# Patient Record
Sex: Female | Born: 1990
Health system: Southern US, Community
[De-identification: ages and names within clinical notes are randomized; demographics above are authoritative.]

## PROBLEM LIST (undated history)

## (undated) ENCOUNTER — Encounter

## (undated) ENCOUNTER — Telehealth

## (undated) DIAGNOSIS — F419 Anxiety disorder, unspecified: Secondary | ICD-10-CM

## (undated) DIAGNOSIS — E785 Hyperlipidemia, unspecified: Secondary | ICD-10-CM

## (undated) HISTORY — DX: Hyperlipidemia, unspecified: E78.5

## (undated) HISTORY — DX: Anxiety disorder, unspecified: F41.9

---
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
## 2016-07-16 ENCOUNTER — Ambulatory Visit: Primary: Family Medicine

## 2016-07-16 DIAGNOSIS — H1013 Acute atopic conjunctivitis, bilateral: Principal | ICD-10-CM

## 2016-07-16 DIAGNOSIS — Z6824 Body mass index (BMI) 24.0-24.9, adult: Principal | ICD-10-CM

## 2016-07-16 DIAGNOSIS — B351 Tinea unguium: Secondary | ICD-10-CM

## 2016-07-16 DIAGNOSIS — E782 Mixed hyperlipidemia: Secondary | ICD-10-CM

## 2016-07-16 MED ORDER — MULTIPLE VITAMIN PO TABS
1 | ORAL_TABLET | Freq: Every day | ORAL
Start: 2016-07-16 — End: ?

## 2016-07-16 MED ORDER — TERBINAFINE HCL 250 MG PO TABS
250 mg | Freq: Every day | ORAL | 0 refills | Status: CP
Start: 2016-07-16 — End: 2016-09-04

## 2016-07-16 MED ORDER — TOLNAFTATE 1 % EX SOLN
1 | Freq: Every day | TOPICAL
Start: 2016-07-16 — End: 2016-10-22

## 2016-07-16 MED ORDER — LASTACAFT 0.25 % OP SOLN
1 [drp] | Freq: Every day | OPHTHALMIC | 1 refills
Start: 2016-07-16 — End: 2016-10-22

## 2016-07-23 NOTE — Progress Notes
BP 104/71 - Pulse 71 - Temp 36.4 ?C (97.5 ?F) - Resp 18 - Ht 1.626 m (5\' 4" ) - Wt 64.4 kg (142 lb) - LMP 06/30/2016 (Exact Date) - BMI 24.37 kg/m2    Physical Exam   Constitutional: She is oriented to person, place, and time. Vital signs are normal. She is cooperative. No distress.   Neurological: She is alert and oriented to person, place, and time.   Skin: Skin is warm, dry and intact. Rash noted. She is not diaphoretic.   Yellow discoloration of great toe bilaterally and R 2nd toe and L 3rd toe. Discoloration is nonhemogenous and in no distributive pattern. Similar discoloration observed under R 2nd toe and L 3rd toe with lifint of the nail from the neail bed. No ertyhema or edema noted in affected area. Non-tender to palpation.        ASSESSMENT/PLAN    Abigail Caldwell was seen today for results and nail problem.    Diagnoses and all orders for this visit:    Body mass index (BMI) of 24.0-24.9 in adult    Other orders  -     LASTACAFT 0.25 % Solution; Apply 1 drop to eye(s) daily.  -     tolnaftate (TINACTIN) 1 % Solution; Apply 1 application topically daily.  -     multivitamin Tablet; Take 1 tablet by mouth daily.  -     terbinafine (lamISIL) 250 MG Tablet; Take 1 tablet by mouth daily for 42 days.  -     LIPID PANEL; Future  -     TSH+FREE T4; Future  -     COMPREHENSIVE METABOLIC PANEL; Future

## 2016-07-23 NOTE — Progress Notes
Review of Systems   Constitutional: Negative for activity change, appetite change, chills, diaphoresis, fatigue, fever and unexpected weight change.   HENT: Negative for congestion, dental problem, drooling, ear discharge, ear pain, facial swelling, hearing loss, mouth sores, nosebleeds, postnasal drip, rhinorrhea, sinus pressure, sneezing, sore throat, tinnitus, trouble swallowing and voice change.    Eyes: Negative for photophobia, pain, discharge, redness, itching and visual disturbance.   Respiratory: Negative for apnea, cough, choking, chest tightness, shortness of breath, wheezing and stridor.    Gastrointestinal: Negative for abdominal distention, abdominal pain, anal bleeding, blood in stool, constipation, diarrhea, nausea, rectal pain and vomiting.   Endocrine: Negative for cold intolerance, heat intolerance, polydipsia, polyphagia and polyuria.   Genitourinary: Negative for decreased urine volume, difficulty urinating, dyspareunia, dysuria, enuresis, flank pain, frequency, genital sores, hematuria, menstrual problem, pelvic pain, urgency, vaginal bleeding, vaginal discharge and vaginal pain.   Musculoskeletal: Negative for arthralgias, back pain, gait problem, joint swelling, myalgias, neck pain and neck stiffness.   Skin: Negative for color change, pallor, rash and wound.   Allergic/Immunologic: Negative for environmental allergies, food allergies and immunocompromised state.   Neurological: Negative for dizziness, tremors, seizures, syncope, facial asymmetry, speech difficulty, weakness, light-headedness, numbness and headaches.   Hematological: Negative for adenopathy. Does not bruise/bleed easily.   Psychiatric/Behavioral: Negative for agitation, behavioral problems, confusion, decreased concentration, dysphoric mood, hallucinations, self-injury, sleep disturbance and suicidal ideas. The patient is not nervous/anxious and is not hyperactive.      Med[facility]     OBJECTIVE

## 2016-07-23 NOTE — Patient Instructions
Fungal Nail Infection  Fungal nail infection is a common fungal infection of the toenails or fingernails. This condition affects toenails more often than fingernails. More than one nail may be infected. The condition can be passed from person to person (is contagious).  CAUSES  This condition is caused by a fungus. Several types of funguses can cause the infection. These funguses are common in moist and warm areas. If your hands or feet come into contact with the fungus, it may get into a crack in your fingernail or toenail and cause the infection.  RISK FACTORS  The following factors may make you more likely to develop this condition:  ? Being female.  ? Having diabetes.  ? Being of older age.  ? Living with someone who has the fungus.  ? Walking barefoot in areas where the fungus thrives, such as showers or locker rooms.  ? Having poor circulation.  ? Wearing shoes and socks that cause your feet to sweat.  ? Having athlete's foot.  ? Having a nail injury or history of a recent nail surgery.  ? Having psoriasis.  ? Having a weak body defense system (immune system).  SYMPTOMS  Symptoms of this condition include:  ? A pale spot on the nail.  ? Thickening of the nail.  ? A nail that becomes yellow or brown.  ? A brittle or ragged nail edge.  ? A crumbling nail.  ? A nail that has lifted away from the nail bed.  DIAGNOSIS  This condition is diagnosed with a physical exam. Your health care provider may take a scraping or clipping from your nail to test for the fungus.  TREATMENT  Mild infections do not need treatment. If you have significant nail changes, treatment may include:  ? Oral antifungal medicines. You may need to take the medicine for several weeks or several months, and you may not see the results for a long time. These medicines can cause side effects. Ask your health care provider what problems to watch for.  ? Antifungal nail polish and nail cream. These may be used along with oral antifungal medicines.

## 2016-07-23 NOTE — Patient Instructions
?   Laser treatment of the nail.  ? Surgery to remove the nail. This may be needed for the most severe infections.  Treatment takes a long time, and the infection may come back.  HOME CARE INSTRUCTIONS  Medicines  ? Take or apply over-the-counter and prescription medicines only as told by your health care provider.  ? Ask your health care provider about using over-the-counter mentholated ointment on your nails.  Lifestyle  ? Do not share personal items, such as towels or nail clippers.  ? Trim your nails often.  ? Wash and dry your hands and feet every day.  ? Wear absorbent socks, and change your socks frequently.  ? Wear shoes that allow air to circulate, such as sandals or canvas tennis shoes. Throw out old shoes.  ? Wear rubber gloves if you are working with your hands in wet areas.  ? Do not walk barefoot in shower rooms or locker rooms.  ? Do not use a nail salon that does not use clean instruments.  ? Do not use artificial nails.  General Instructions  ? Keep all follow-up visits as told by your health care provider. This is important.  ? Use antifungal foot powder on your feet and in your shoes.  SEEK MEDICAL CARE IF:   Your infection is not getting better or it is getting worse after several months.     This information is not intended to replace advice given to you by your health care provider. Make sure you discuss any questions you have with your health care provider.     Document Released: 08/15/2000 Document Revised: 12/10/2015 Document Reviewed: 02/19/2015  Elsevier Interactive Patient Education ?2017 Elsevier Inc.

## 2016-07-23 NOTE — Progress Notes
RTC as scheduled or sooner if no improvement or any other complications.  Patient was advised to contact our office if they have not heard back from us on any testing that was performed or any required referral or ancillary services.  The patient voiced understanding.    Orders Placed This Encounter   Medications   ? terbinafine (lamISIL) 250 MG Tablet     Sig: Take 1 tablet by mouth daily for 42 days.     Dispense:  42 tablet     Refill:  0     Orders Placed This Encounter   Procedures   ? LIPID PANEL   ? TSH+FREE T4   ? COMPREHENSIVE METABOLIC PANEL       Health Maintenance was reviewed. The patient's HM Topic list was:                                            Health Maintenance   Topic Date Due   ? Preventive Wellness Visit  10/30/2016   ? Basic Metabolic Panel  12/18/2016   ? Pap Smear  10/31/2018   ? DTaP,Tdap,and Td Vaccines (7 - Td) 03/29/2019   ? Lipid Profile  12/18/2020   ? USPSTF HIV Risk Assessment  Completed   ? Varicella Vaccine  Completed   ? Influenza Vaccine  Completed

## 2016-07-23 NOTE — Progress Notes
Medication Sig   ? norethindrone-ethinyl estradiol (LOESTRIN 1/20, 21,) 1-20 MG-MCG per tablet daily. 1 mg-mcg (Instructions per Allscripts)     No current facility-administered medications on file prior to visit.      Allergies   Allergen Reactions   ? No Known Drug Allergy      No Known Drug Allergies, No Known Drug Allergies        Objective:        VITAL SIGNS (all recorded)      Clinic Vitals       07/16/16 0835             Amb Encounter Vitals    Weight 64.4 kg (142 lb)    -ED at 07/16/16 0836       Height 1.626 m (5\' 4" )    -ED at 07/16/16 0836       BMI (Calculated) 24.43    -ED at 07/16/16 0836       BSA (Calculated - sq m) 1.71    -ED at 07/16/16 0836       BP 104/71    -ED at 07/16/16 0836       BP Location Left upper arm    -ED at 07/16/16 0836       Position Sitting    -ED at 07/16/16 0836       Pulse 71    -ED at 07/16/16 0836       Pulse Quality Normal    -ED at 07/16/16 0836       Resp 18    -ED at 07/16/16 0836       Respiration Quality Normal    -ED at 07/16/16 0836       Temp 36.4 ?C (97.5 ?F)    -ED at 07/16/16 0836       Temperature Source Oral    -ED at 07/16/16 0836       O2 Saturation 100 %    -ED at 07/16/16 0836       Pain Score Zero    -ED at 07/16/16 0836       Last Menstrual Period 06/30/16    -ED at 07/16/16 0836       Education/Communication Barriers?    Learning/Communication Barriers? No    -ED at 07/16/16 0836       Fall Risk Assessment    Had recent fall / Last 6 months? No recent fall    -ED at 07/16/16 0836       Does patient have a fear of falling? No    -ED at 07/16/16 0836         User Key  (r) = Recorded By, (t) = Taken By, (c) = Cosigned By    [facility] Name Effective Dates    ED Apolonio Schneidersesuyo, Erin, MA 12/04/15 -         Physical Exam   Constitutional: She is oriented to person, place, and time. She appears well-developed and well-nourished. No distress.   HENT:   Head: Normocephalic and atraumatic.   Eyes: Conjunctivae and EOM are normal. Pupils are equal, round, and

## 2016-07-23 NOTE — Progress Notes
Genitourinary: Negative for decreased urine volume, difficulty urinating, dyspareunia, dysuria, enuresis, flank pain, frequency, genital sores, hematuria, menstrual problem, pelvic pain, urgency, vaginal bleeding, vaginal discharge and vaginal pain.   Musculoskeletal: Negative for arthralgias, back pain, gait problem, joint swelling, myalgias, neck pain and neck stiffness.   Skin: Negative for color change, pallor, rash and wound.   Allergic/Immunologic: Negative for environmental allergies, food allergies and immunocompromised state.   Neurological: Negative for dizziness, tremors, seizures, syncope, facial asymmetry, speech difficulty, weakness, light-headedness, numbness and headaches.   Hematological: Negative for adenopathy. Does not bruise/bleed easily.   Psychiatric/Behavioral: Negative for agitation, behavioral problems, confusion, decreased concentration, dysphoric mood, hallucinations, self-injury, sleep disturbance and suicidal ideas. The patient is not nervous/anxious and is not hyperactive.        Past Medical History:   Diagnosis Date   ? Conjunctivitis, allergic, bilateral      Past Surgical History:   Procedure Laterality Date   ? RHINOPLASTY - PRIMARY, COMPLEX--INCLUDES SEPTOPLASTY       Family History   Problem Relation Age of Onset   ? High Blood Pressure Mother    ? No Known Problems Father    ? Heart Disease Maternal Grandfather    ? Heart Disease Paternal Grandfather      Social History     Social History   ? Marital status: Single     Spouse name: N/A   ? Number of children: N/A   ? Years of education: N/A     Occupational History   ? Not on file.     Social History Main Topics   ? Smoking status: Never Smoker   ? Smokeless tobacco: Never Used   ? Alcohol use No   ? Drug use: No   ? Sexual activity: Yes     Partners: Male     Birth control/ protection: OCP, Condom     Other Topics Concern   ? Not on file     Social History Narrative     Current Outpatient Prescriptions on File Prior to Visit

## 2016-07-23 NOTE — Progress Notes
MEDICAL STUDENT NOTE  ID: Abigail Shayatalie Palmateer - 25 y.o. female ([id number])  Reason for Visit: Results and Nail Problem      SUBJECTIVE  Abigail Caldwell is a pleasant 25 yo female with no significant PMH who presents to clinic to discuss recent lab results and for a new onset rash on her toenails. She states that she had a nonfasting lipid panel perfomred 6 months ago which was significant for elevated LDL and TG. She had a repeat fasting lipid panel and CMP performed the following week which showed slight improvement but was still significant for HLD and elevated TG. She acknolwedges making lifestyle changes including exercising 4-5x per week and eliminating meats and dairy from her diet. She admits to having a strong family history of hyperlipidemia     Past Medical History:   Diagnosis Date   ? Conjunctivitis, allergic, bilateral      Past Surgical History:   Procedure Laterality Date   ? RHINOPLASTY - PRIMARY, COMPLEX--INCLUDES SEPTOPLASTY       Social History     Social History   ? Marital status: Single     Spouse name: N/A   ? Number of children: N/A   ? Years of education: N/A     Occupational History   ? Not on file.     Social History Main Topics   ? Smoking status: Never Smoker   ? Smokeless tobacco: Never Used   ? Alcohol use No   ? Drug use: No   ? Sexual activity: Yes     Partners: Male     Birth control/ protection: OCP, Condom     Other Topics Concern   ? Not on file     Social History Narrative     Outpatient Encounter Prescriptions as of 07/16/2016   Medication Sig Dispense Refill   ? multivitamin Tablet Take 1 tablet by mouth daily.     ? tolnaftate (TINACTIN) 1 % Solution Apply 1 application topically daily.     ? LASTACAFT 0.25 % Solution Apply 1 drop to eye(s) daily.  1   ? norethindrone-ethinyl estradiol (LOESTRIN 1/20, 21,) 1-20 MG-MCG per tablet daily. 1 mg-mcg (Instructions per Allscripts)       No facility-administered encounter medications on file as of 07/16/2016.          MedStu[facility]

## 2016-07-23 NOTE — Progress Notes
reactive to light.   Neck: Neck supple. No thyromegaly present.   Cardiovascular: Normal rate, regular rhythm and normal heart sounds.    Pulmonary/Chest: Effort normal and breath sounds normal. No respiratory distress. She has no wheezes. She has no rales.   Musculoskeletal: She exhibits no tenderness or deformity.   Neurological: She is alert and oriented to person, place, and time. No cranial nerve deficit.   Skin: Skin is warm. No erythema.   Yellow discoloration of great toe bilaterally and R 2nd toe and L 3rd toe. Discoloration is nonhemogenous and in no distributive pattern. Similar discoloration observed under R 2nd toe and L 3rd toe with lifint of the nail from the neail bed. No ertyhema or edema noted in affected area. Non-tender to palpation.    Psychiatric: She has a normal mood and affect. Her behavior is normal. Thought content normal.       Assessment:       ICD-10-CM ICD-9-CM    1. Body mass index (BMI) of 24.0-24.9 in adult Z68.24 V85.1    2. Onychomycosis of toenail B35.1 110.1 terbinafine (lamISIL) 250 MG Tablet      COMPREHENSIVE METABOLIC PANEL   3. Mixed hyperlipidemia E78.2 272.2 LIPID PANEL      TSH+FREE T4      TSH+FREE T4       Plan:     The patient was advised of various treatment options and agreed to the following:The risks vs benefits of medication and treatment were discussed.  Medications were reconciled and refills/prescriptions were given as needed.  Safe over the counter medications were discussed.  Goals for self management were discussed, log sheet was provided and previous log sheet was reviewed.  Previous laboratory and ancillary testing was reviewed/ordered today.  Written patient education materials were provided today.  Healthy  Lifestyle including the importance of healthy diet and exercise as tolerated.  Avoidance of tobacco products was emphasized.  The patient was reminded of the importance of follow up visits and compliance with prescribed treatment plan.

## 2016-07-23 NOTE — Progress Notes
Subjective:     Abigail Caldwell is a 25 y.o. female being seen today for Results and Nail Problem    The patient is here today for follow up and management of  medical conditions listed on the problem list. The patient is currently prescribed the medications listed in the medications list.   Pain level and fall risk were assessed.Health issues since the last visit were reviewed.    Pt with no significant PMH who presents to clinic to discuss recent lab results and for a new onset rash on her toenails. She states that she had a nonfasting lipid panel perfomred 6 months ago which was significant for elevated LDL and TG. She had a repeat fasting lipid panel and CMP performed the following week which showed slight improvement but was still significant for HLD and elevated TG. She acknolwedges making lifestyle changes including exercising 4-5x per week and eliminating meats and dairy from her diet. She admits to having a strong family history of hyperlipidemia     Please see review of systems below.    Review of Systems  Review of Systems   Constitutional: Negative for activity change, appetite change, chills, diaphoresis, fatigue, fever and unexpected weight change.   HENT: Negative for congestion, dental problem, drooling, ear discharge, ear pain, facial swelling, hearing loss, mouth sores, nosebleeds, postnasal drip, rhinorrhea, sinus pressure, sneezing, sore throat, tinnitus, trouble swallowing and voice change.    Eyes: Negative for photophobia, pain, discharge, redness, itching and visual disturbance.   Respiratory: Negative for apnea, cough, choking, chest tightness, shortness of breath, wheezing and stridor.    Gastrointestinal: Negative for abdominal distention, abdominal pain, anal bleeding, blood in stool, constipation, diarrhea, nausea, rectal pain and vomiting.   Endocrine: Negative for cold intolerance, heat intolerance, polydipsia, polyphagia and polyuria.

## 2016-09-02 DIAGNOSIS — E782 Mixed hyperlipidemia: Principal | ICD-10-CM

## 2016-09-02 DIAGNOSIS — B351 Tinea unguium: Principal | ICD-10-CM

## 2016-09-03 ENCOUNTER — Inpatient Hospital Stay: Admit: 2016-09-03 | Discharge: 2016-09-03 | Primary: Family Medicine

## 2016-09-04 DIAGNOSIS — B351 Tinea unguium: Principal | ICD-10-CM

## 2016-09-04 MED ORDER — TERBINAFINE HCL 250 MG PO TABS
250 mg | Freq: Every day | ORAL | 0 refills | Status: CP
Start: 2016-09-04 — End: 2016-10-22

## 2016-09-04 NOTE — Telephone Encounter
From: Ree ShayNatalie Anding  To: Riley NearingGajda, Anna, MD  Sent: 09/03/2016 4:43 PM EST  Subject:  Medication Renewal Request    Original  authorizing provider: Riley NearingAnna Gajda, MD    Ree ShayNatalie  Skillin would like a refill of the following medications:  terbinafine  (lamISIL) 250 MG Tablet Riley Nearing[Anna Gajda, MD]    Preferred  pharmacy: [facility] [id number] - [place], [place] - [address]    Comment:  Dr.  Johnnette LitterGajda, Can The 2nd part of the terbinafine be refilled since my CMP came back normal? Thank you! Ree ShayNatalie Parzych

## 2016-10-22 ENCOUNTER — Ambulatory Visit: Attending: Family Medicine | Primary: Family Medicine

## 2016-10-22 DIAGNOSIS — N3000 Acute cystitis without hematuria: Secondary | ICD-10-CM

## 2016-10-22 DIAGNOSIS — R300 Dysuria: Secondary | ICD-10-CM

## 2016-10-22 DIAGNOSIS — Z6823 Body mass index (BMI) 23.0-23.9, adult: Principal | ICD-10-CM

## 2016-10-22 DIAGNOSIS — R399 Unspecified symptoms and signs involving the genitourinary system: Secondary | ICD-10-CM

## 2016-10-22 DIAGNOSIS — H1013 Acute atopic conjunctivitis, bilateral: Principal | ICD-10-CM

## 2016-10-22 MED ORDER — SULFAMETHOXAZOLE-TRIMETHOPRIM 800-160 MG PO TABS
1 | ORAL_TABLET | Freq: Two times a day (BID) | ORAL | 0 refills | Status: CP
Start: 2016-10-22 — End: 2016-10-25

## 2016-10-22 MED ORDER — DICLOFENAC SODIUM 1 % TD GEL
1 refills
Start: 2016-10-22 — End: 2017-11-12

## 2016-10-22 NOTE — Patient Instructions
Urinary Tract Infection, Adult  A urinary tract infection (UTI) is an infection of any part of the urinary tract. The urinary tract includes the:  ? Kidneys.  ? Ureters.  ? Bladder.  ? Urethra.  These organs make, store, and get rid of pee (urine) in the body.  Follow these instructions at home:  ? Take over-the-counter and prescription medicines only as told by your doctor.  ? If you were prescribed an antibiotic medicine, take it as told by your doctor. Do not stop taking the antibiotic even if you start to feel better.  ? Avoid the following drinks:  ? Alcohol.  ? Caffeine.  ? Tea.  ? Carbonated drinks.  ? Drink enough fluid to keep your pee clear or pale yellow.  ? Keep all follow-up visits as told by your doctor. This is important.  ? Make sure to:  ? Empty your bladder often and completely. Do not to hold pee for long periods of time.  ? Empty your bladder before and after sex.  ? Wipe from front to back after a bowel movement if you are female. Use each tissue one time when you wipe.  Contact a doctor if:  ? You have back pain.  ? You have a fever.  ? You feel sick to your stomach (nauseous).  ? You throw up (vomit).  ? Your symptoms do not get better after 3 days.  ? Your symptoms go away and then come back.  Get help right away if:  ? You have very bad back pain.  ? You have very bad lower belly (abdominal) pain.  ? You are throwing up and cannot keep down any medicines or water.  This information is not intended to replace advice given to you by your health care provider. Make sure you discuss any questions you have with your health care provider.  Document Released: 02/04/2008 Document Revised: 01/24/2016 Document Reviewed: 07/09/2015  Elsevier Interactive Patient Education ? 2017 Elsevier Inc.

## 2016-10-24 DIAGNOSIS — N3000 Acute cystitis without hematuria: Secondary | ICD-10-CM

## 2016-10-24 DIAGNOSIS — R399 Unspecified symptoms and signs involving the genitourinary system: Principal | ICD-10-CM

## 2016-10-24 MED ORDER — SULFAMETHOXAZOLE-TRIMETHOPRIM 800-160 MG PO TABS
1 | ORAL_TABLET | Freq: Two times a day (BID) | ORAL | 0 refills | Status: CP
Start: 2016-10-24 — End: 2017-11-12

## 2016-10-25 NOTE — Progress Notes
?   Smoking status: Never Smoker   ? Smokeless tobacco: Never Used   ? Alcohol use No   ? Drug use: No   ? Sexual activity: Yes     Partners: Male     Birth control/ protection: OCP, Condom     Other Topics Concern   ? Not on file     Social History Narrative     Current Outpatient Prescriptions on File Prior to Visit   Medication Sig   ? [DISCONTINUED] LASTACAFT 0.25 % Solution Apply 1 drop to eye(s) daily.   ? multivitamin Tablet Take 1 tablet by mouth daily.   ? norethindrone-ethinyl estradiol (LOESTRIN 1/20, 21,) 1-20 MG-MCG per tablet daily. 1 mg-mcg (Instructions per Allscripts)   ? [DISCONTINUED] terbinafine (lamISIL) 250 MG PO Tablet Take 1 tablet by mouth daily for 42 days.   ? [DISCONTINUED] tolnaftate (TINACTIN) 1 % Solution Apply 1 application topically daily.     No current facility-administered medications on file prior to visit.      Allergies   Allergen Reactions   ? No Known Drug Allergy      No Known Drug Allergies, No Known Drug Allergies         Review of Systems  Review of Systems   Constitutional: Negative for activity change, appetite change, chills, diaphoresis, fatigue, fever and unexpected weight change.   HENT: Negative for congestion, ear pain, mouth sores, sinus pain and tinnitus.    Eyes: Negative for pain, discharge and redness.   Respiratory: Negative for cough, chest tightness and wheezing.    Cardiovascular: Negative for chest pain and leg swelling.   Gastrointestinal: Negative for abdominal distention, abdominal pain, anorexia, nausea and vomiting.   Endocrine: Negative for cold intolerance, polydipsia, polyphagia and polyuria.   Genitourinary: Positive for dysuria, frequency and pelvic pain. Negative for difficulty urinating, flank pain, hematuria, hesitancy and urgency.   Musculoskeletal: Negative for arthralgias, joint swelling, myalgias and neck pain.   Skin: Negative for pallor and rash.   Neurological: Negative for dizziness, weakness, light-headedness, numbness

## 2016-10-25 NOTE — Progress Notes
and headaches.           Objective:        VITAL SIGNS (all recorded)      Clinic Vitals       10/22/16 0734             Amb Encounter Vitals    Weight 63 kg (139 lb)    -ED at 10/22/16 0735       Height 1.626 m (5\' 4" )    -ED at 10/22/16 0735       BMI (Calculated) 23.91    -ED at 10/22/16 0735       BSA (Calculated - sq m) 1.69    -ED at 10/22/16 0735       BP 116/74    -ED at 10/22/16 0735       BP Location Left upper arm    -ED at 10/22/16 0735       Position Sitting    -ED at 10/22/16 0735       Pulse 64    -ED at 10/22/16 0735       Pulse Quality Normal    -ED at 10/22/16 0735       Resp 18    -ED at 10/22/16 0735       Respiration Quality Normal    -ED at 10/22/16 0735       Temp 37.2 ?C (99 ?F)    -ED at 10/22/16 0735       Temperature Source Oral    -ED at 10/22/16 0735       O2 Saturation 99 %    -ED at 10/22/16 0735       Pain Score Three    -ED at 10/22/16 0735       Last Menstrual Period 10/15/16    -ED at 10/22/16 16100735       Education/Communication Barriers?    Learning/Communication Barriers? No    -ED at 10/22/16 0735       Fall Risk Assessment    Had recent fall / Last 6 months? No recent fall    -ED at 10/22/16 [id number]       Does patient have a fear of falling? No    -ED at 10/22/16 0735         User Key  (r) = Recorded By, (t) = Taken By, (c) = Cosigned By    [facility]    ED Apolonio Schneidersesuyo, Erin, MA 12/04/15 -         Physical Exam   Constitutional: She is oriented to person, place, and time. She appears well-developed and well-nourished. No distress.   HENT:   Head: Normocephalic.   Mouth/Throat: Oropharynx is clear and moist.   Eyes: EOM are normal. Pupils are equal, round, and reactive to light. Right eye exhibits no discharge. Left eye exhibits no discharge.   Neck: Normal range of motion. Neck supple. No thyromegaly present.   Cardiovascular: Normal rate and regular rhythm.    No murmur heard.  Pulmonary/Chest: Effort normal. She has no wheezes.

## 2016-10-25 NOTE — Progress Notes
Abdominal: Soft. Bowel sounds are normal. She exhibits no distension and no mass. There is no tenderness.   Musculoskeletal: Normal range of motion. She exhibits no edema or tenderness.   Lymphadenopathy:     She has no cervical adenopathy.   Neurological: She is alert and oriented to person, place, and time. No cranial nerve deficit. Coordination normal.   Skin: Skin is warm and dry. She is not diaphoretic. No erythema.   Vitals reviewed.       Assessment:       ICD-10-CM ICD-9-CM    1. Acute cystitis without hematuria N30.00 595.0 sulfamethoxazole-trimethoprim (BACTRIM DS,SEPTRA DS) 800-160 MG PO Tablet   2. Body mass index (BMI) of 23.0-23.9 in adult Z68.23 V85.1    3. Symptoms involving urinary system R39.9 788.99 POCT Urinalysis w/out microscopy inhouse-AUTO      sulfamethoxazole-trimethoprim (BACTRIM DS,SEPTRA DS) 800-160 MG PO Tablet   4. Dysuria R30.0 788.1           Plan:     Orders Placed This Encounter   Medications   ? sulfamethoxazole-trimethoprim (BACTRIM DS,SEPTRA DS) 800-160 MG PO Tablet     Sig: Take 1 tablet by mouth 2 times daily.     Dispense:  6 tablet     Refill:  0     Orders Placed This Encounter   Procedures   ? POCT Urinalysis w/out microscopy inhouse-AUTO       Health Maintenance was reviewed. The patient's HM Topic list was:                                            Health Maintenance   Topic Date Due   ? Preventive Wellness Visit  10/30/2016   ? Basic Metabolic Panel  09/02/2017   ? Pap Smear  10/31/2018   ? DTaP,Tdap,and Td Vaccines (7 - Td) 03/29/2019   ? Lipid Profile  09/02/2021   ? USPSTF HIV Risk Assessment  Completed   ? Varicella Vaccine  Completed   ? Influenza Vaccine  Completed       Return if symptoms worsen or fail to improve.  AND   PRN.    POCT Urinalysis w/out microscopy inhouse-AUTO (Order [id number])   POCT Urinalysis w/out microscopy inhouse-AUTO   Order: [id number]   Status:  Final result ??Visible to patient:  Yes ([facility]) Next appt:

## 2016-10-25 NOTE — Progress Notes
Subjective:   Ree Shayatalie Barich is a 26 y.o. female being seen today for Dysuria and Urinary Tract Infection       Dysuria    This is a new problem. The current episode started in the past 7 days. The problem occurs every urination. The problem has been unchanged. The quality of the pain is described as burning. The patient is experiencing no pain. There has been no fever. She is sexually active. There is no history of pyelonephritis. Associated symptoms include frequency. Pertinent negatives include no chills, discharge, flank pain, hematuria, hesitancy, nausea, possible pregnancy, sweats, urgency or vomiting. She has tried antibiotics for the symptoms. The treatment provided mild relief. There is no history of catheterization, kidney stones, recurrent UTIs, a single kidney, urinary stasis or a urological procedure.   Urinary Tract Infection   This is a new problem. The current episode started in the past 7 days. The problem occurs rarely. The problem has been unchanged. Associated symptoms include urinary symptoms. Pertinent negatives include no abdominal pain, anorexia, arthralgias, chest pain, chills, congestion, coughing, diaphoresis, fatigue, fever, headaches, joint swelling, myalgias, nausea, neck pain, numbness, rash, vomiting or weakness. Nothing aggravates the symptoms. The treatment provided mild relief.       Past Medical History:   Diagnosis Date   ? Conjunctivitis, allergic, bilateral      Past Surgical History:   Procedure Laterality Date   ? RHINOPLASTY - PRIMARY, COMPLEX--INCLUDES SEPTOPLASTY       Family History   Problem Relation Age of Onset   ? High Blood Pressure Mother    ? No Known Problems Father    ? Heart Disease Maternal Grandfather    ? Heart Disease Paternal Grandfather      Social History     Social History   ? Marital status: Single     Spouse name: N/A   ? Number of children: N/A   ? Years of education: N/A     Occupational History   ? Not on file.     Social History Main Topics

## 2016-10-25 NOTE — Progress Notes
None Dx:  Symptoms involving urinary system      2d ago     Clarity, UA cloudy   Color -Ur dark brown   Glucose -Ur negative   Bilirubin -Ur negative   Ketones negative   Spec Grav 1.015   Blood, UA negative   pH 5.0   Protein -Ur negative   Urobilinogen -Ur 0.25   Nitrite -Ur positive   Leukocytes negative   Appearance, Fluid    Lot Number    Expiration Date       Specimen Collected: 10/22/16 Last Resulted: 10/22/16 Lab Flowsheet Order Details View Encounter Lab and Collection Details                     Discussed plan of care with patient.  Patient voiced agreement and understanding.    Patient is also advised to various treatment options and agreed to the following: taking medications as directed, keeping follow up visits.  Pt voiced agreement and understanding.    Lifestyle  Goals :  Increase exercise / activity minimum 150 minutes per week of low impact cardiovascular exercise.    Treatment goals :  Take medications as prescribed and make sure health maintenance is up to date.    Self - management goals : Decrease sugar and salt intake.  Keep  Total calories intake to less than 2000 calories per day.    Potential barriers to goals : Motivation, medication side effects.      Dip U / A  WITH  POSITIVE NITRITE.

## 2017-11-12 ENCOUNTER — Encounter

## 2017-11-12 ENCOUNTER — Ambulatory Visit: Primary: Family Medicine

## 2017-11-12 DIAGNOSIS — M7750 Other enthesopathy of unspecified foot: Secondary | ICD-10-CM

## 2017-11-12 DIAGNOSIS — T753XXA Motion sickness, initial encounter: Secondary | ICD-10-CM

## 2017-11-12 DIAGNOSIS — Z6824 Body mass index (BMI) 24.0-24.9, adult: Principal | ICD-10-CM

## 2017-11-12 DIAGNOSIS — E538 Deficiency of other specified B group vitamins: Secondary | ICD-10-CM

## 2017-11-12 DIAGNOSIS — B379 Candidiasis, unspecified: Secondary | ICD-10-CM

## 2017-11-12 DIAGNOSIS — E782 Mixed hyperlipidemia: Principal | ICD-10-CM

## 2017-11-12 MED ORDER — DICLOFENAC SODIUM 1 % TD GEL
4 g | Freq: Four times a day (QID) | TOPICAL | 3 refills | Status: CP
Start: 2017-11-12 — End: ?

## 2017-11-12 MED ORDER — FLUCONAZOLE 150 MG PO TABS
150 mg | Freq: Once | ORAL | 1 refills | Status: CP
Start: 2017-11-12 — End: ?

## 2017-11-12 MED ORDER — NUVARING 0.12-0.015 MG/24HR VA RING
1 | VAGINAL | 5 refills
Start: 2017-11-12 — End: ?

## 2017-11-12 MED ORDER — SCOPOLAMINE 1 MG/3DAYS TD PT72
1 | MEDICATED_PATCH | TRANSDERMAL | 1 refills | Status: CP
Start: 2017-11-12 — End: ?

## 2017-11-12 NOTE — Patient Instructions
Talk to your pediatrician regarding the use of this medicine in children. Special care may be needed.  Overdosage: If you think you have taken too much of this medicine contact a poison control center or emergency room at once.  NOTE: This medicine is only for you. Do not share this medicine with others.  What if I miss a dose?  Make sure you apply the patch at least 4 hours before you need it. You can apply it the night before traveling.  What may interact with this medicine?  -benztropine  -bethanechol  -medicines for anxiety or sleeping problems like diazepam or temazepam  -medicines for hay fever and other allergies  -medicines for mental depression  -muscle relaxants  This list may not describe all possible interactions. Give your health care provider a list of all the medicines, herbs, non-prescription drugs, or dietary supplements you use. Also tell them if you smoke, drink alcohol, or use illegal drugs. Some items may interact with your medicine.  What should I watch for while using this medicine?  Keep the patch dry, if possible, to prevent it from falling off. Limited contact with water, however, as in bathing or swimming, will not affect the system. If the patch falls off, throw it away and put a new one behind the other ear.  You may get drowsy or dizzy. Do not drive, use machinery, or do anything that needs mental alertness until you know how this medicine affects you. Do not stand or sit up quickly, especially if you are an older patient. This reduces the risk of dizzy or fainting spells. Alcohol may interfere with the effect of this medicine. Avoid alcoholic drinks.  Your mouth may get dry. Chewing sugarless gum or sucking hard candy, and drinking plenty of water may help. Contact your doctor if the problem does not go away or is severe.  This medicine may cause dry eyes and blurred vision. If you wear contact lenses you may feel some discomfort. Lubricating drops may help. See your

## 2017-11-12 NOTE — Patient Instructions
eye doctor if the problem does not go away or is severe.  If you are going to have a magnetic resonance imaging (MRI) procedure, tell your MRI technician if you have this patch on your body. It must be removed before a MRI.  What side effects may I notice from receiving this medicine?  Side effects that you should report to your doctor or health care professional as soon as possible:  -agitation, nervousness, confusion  -blurred vision and other eye problems  -dizziness, drowsiness  -eye pain or redness in the whites of the eye  -hallucinations  -pain or difficulty passing urine  -skin rash, itching  -vomiting  Side effects that usually do not require medical attention (report to your doctor or health care professional if they continue or are bothersome):  -headache  -nausea  This list may not describe all possible side effects. Call your doctor for medical advice about side effects. You may report side effects to FDA at 1-800-FDA-1088.  Where should I keep my medicine?  Keep out of the reach of children.  Store at room temperature between 20 and 25 degrees C (68 and 77 degrees F). Throw away any unused medicine after the expiration date. When you remove a patch, fold it and throw it in the trash as described above.  NOTE: This sheet is a summary. It may not cover all possible information. If you have questions about this medicine, talk to your doctor, pharmacist, or health care provider.  ? 2019 Elsevier/Gold Standard (2012-01-15 13:31:48)

## 2017-11-12 NOTE — Patient Instructions
Scopolamine skin patches  What is this medicine?  SCOPOLAMINE (skoe POL a meen) is used to prevent nausea and vomiting caused by motion sickness, anesthesia and surgery.  This medicine may be used for other purposes; ask your health care provider or pharmacist if you have questions.  COMMON BRAND NAME(S): Transderm Scop  What should I tell my health care provider before I take this medicine?  They need to know if you have any of these conditions:  -glaucoma  -kidney or liver disease  -an unusual or allergic reaction (especially skin allergy) to scopolamine, atropine, other medicines, foods, dyes, or preservatives  -pregnant or trying to get pregnant  -breast-feeding  How should I use this medicine?  This medicine is for external use only. Follow the directions on the prescription label. One patch contains enough medicine to prevent motion sickness for up to 3 days. Apply the patch at least 4 hours before you need it and only wear one disc at a time. Choose an area behind the ear, that is clean, dry, hairless and free from any cuts or irritation. Wipe the area with a clean dry tissue. Peel off the plastic backing of the skin patch, trying not to touch the adhesive side with your hands. Do not cut the patches. Firmly apply to the area you have chosen, with the metallic side of the patch to the skin and the tan-colored side showing. Once firmly in place, wash your hands well with soap and water. Remove the disc after 3 days, or sooner if you no longer need it. After removing the patch, wash your hands and the area behind your ear thoroughly with soap and water. The patch will still contain some medicine after use. To avoid accidental contact or ingestion by children or pets, fold the used patch in half with the sticky side together and throw away in the trash out of the reach of children and pets. If you need to use a second patch after you remove the first, place it behind the other ear.

## 2017-11-16 NOTE — Progress Notes
-  ED at 11/12/17 1008       BP 120/82    -ED at 11/12/17 1008       BP Location Left upper arm    -ED at 11/12/17 1008       Position Sitting    -ED at 11/12/17 1008       Pulse 68    -ED at 11/12/17 1008       Pulse Quality Normal    -ED at 11/12/17 1008       Resp 16    -ED at 11/12/17 1008       Respiration Quality Normal    -ED at 11/12/17 1008       Temp 37 ?C (98.6 ?F)    -ED at 11/12/17 1008       Temperature Source Oral    -ED at 11/12/17 1008       O2 Saturation 100 %    -ED at 11/12/17 1008       Pain Score Zero    -ED at 11/12/17 1008       Last Menstrual Period 11/07/17    -ED at 11/12/17 1008          Education/Communication Barriers?    Learning/Communication Barriers? No    -ED at 11/12/17 1008          Fall Risk Assessment    Had recent fall / Last 6 months? No recent fall    -ED at 11/12/17 1008       Does patient have a fear of falling? No    -ED at 11/12/17 1008         User Key  (r) = Recorded By, (t) = Taken By, (c) = Cosigned By    [facility] D[facility]    ED Apolonio Schneidersesuyo, Erin, MA 12/04/15 -           Physical Exam   Constitutional: She is oriented to person, place, and time. She appears well-developed and well-nourished. No distress.   HENT:   Head: Normocephalic and atraumatic.   Right Ear: External ear normal.   Left Ear: External ear normal.   Nose: Nose normal.   Mouth/Throat: Oropharynx is clear and moist. No oropharyngeal exudate.   Eyes: Pupils are equal, round, and reactive to light. Conjunctivae and EOM are normal. Right eye exhibits no discharge. Left eye exhibits no discharge. No scleral icterus.   Neck: No JVD present. No thyromegaly present.   Cardiovascular: Normal rate, regular rhythm, normal heart sounds and intact distal pulses.  Exam reveals no gallop and no friction rub.    No murmur heard.  Pulmonary/Chest: Effort normal and breath sounds normal. No respiratory distress. She has no wheezes. She has no rales. She exhibits no tenderness.

## 2017-11-16 NOTE — Progress Notes
History, Medications, Allergies:     Past Medical History:   Diagnosis Date   ? Conjunctivitis, allergic, bilateral      Past Surgical History:   Procedure Laterality Date   ? RHINOPLASTY - PRIMARY, COMPLEX--INCLUDES SEPTOPLASTY       Family History   Problem Relation Age of Onset   ? High Blood Pressure Mother    ? No Known Problems Father    ? Heart Disease Maternal Grandfather    ? Heart Disease Paternal Grandfather      Social History     Social History   ? Marital status: Single     Spouse name: N/A   ? Number of children: N/A   ? Years of education: N/A     Occupational History   ? Not on file.     Social History Main Topics   ? Smoking status: Never Smoker   ? Smokeless tobacco: Never Used   ? Alcohol use No   ? Drug use: No   ? Sexual activity: Yes     Partners: Male     Birth control/ protection: OCP, Condom     Other Topics Concern   ? Not on file     Social History Narrative   ? No narrative on file     Current Outpatient Prescriptions on File Prior to Visit   Medication Sig   ? [DISCONTINUED] diclofenac (VOLTAREN GEL) 1 % TD Gel APPLY 1 GRAM FOUR TIMES A DAY   ? multivitamin Tablet Take 1 tablet by mouth daily.   ? [DISCONTINUED] norethindrone-ethinyl estradiol (LOESTRIN 1/20, 21,) 1-20 MG-MCG per tablet daily. 1 mg-mcg (Instructions per Allscripts)   ? [DISCONTINUED] sulfamethoxazole-trimethoprim (BACTRIM DS,SEPTRA DS) 800-160 MG PO Tablet Take 1 tablet by mouth 2 times daily.     No current facility-administered medications on file prior to visit.      Allergies   Allergen Reactions   ? No Known Drug Allergy      No Known Drug Allergies, No Known Drug Allergies       Objective:        VITAL SIGNS (all recorded)      Clinic Vitals     Row Name 11/12/17 1007                Amb Encounter Vitals    Weight 64 kg (141 lb)    -ED at 11/12/17 1008       Height 1.626 m (5\' 4" )    -ED at 11/12/17 1008       BMI (Calculated) 24.25    -ED at 11/12/17 1008       BSA (Calculated - sq m) 1.7

## 2017-11-16 NOTE — Progress Notes
Subjective:     We had the pleasure of seeing Abigail Caldwell who is a 27 y.o.  Caucasian female being seen today for Medication - New; Medications Refill; and Follow-up    The patient is also here today for follow up and management of multiple chronic medical conditions listed on the problem list. The patient is currently prescribed the medications listed in the medications list.    Previously recommended goals for self management and compliance were reviewed. Pain level and fall risk were assessed. Health issues since the last visit were reviewed. Current medical conditions are stable except as listed above.    Today, patient presents with a request for medications due to the fact she [place] and will be flying, boating, and an abundant amount of physical activity. She reports she has tendinitis in her foot and will need something to control her pain. Abigail Caldwell is requesting Diflucan, Voltaren gel, and Scopolamine patches.     The patient also presents with a request to get her B12 levels tested. She reports that in college she was told that her levels were borderline low, and admits to previously taking B12 supplements. Abigail Caldwell reports that she has not taken any B12 supplements "in a while" and is concerned about her bodies current levels. She also admits to currently taking a daily multivitamin.     Please see review of systems below.         Review of Systems:   Review of Systems   Constitutional: Negative for activity change, appetite change, chills, fatigue, fever and unexpected weight change.   Respiratory: Negative for cough, choking, chest tightness, shortness of breath and wheezing.    Cardiovascular: Negative for chest pain, palpitations and leg swelling.   Endocrine: Negative for cold intolerance, heat intolerance, polydipsia, polyphagia and polyuria.   Musculoskeletal:        Tendinitis in foot   All other systems reviewed and are negative.

## 2017-11-16 NOTE — Progress Notes
us on any testing that was performed or any required referral or ancillary services.  The patient voiced understanding.      Orders Placed This Encounter   Medications   ? diclofenac (VOLTAREN GEL) 1 % TD Gel     Sig: Apply 4 g topically 4 times daily. **Measure dose using enclosed dose card**     Dispense:  100 g     Refill:  3   ? scopolamine (TRANSDERM-SCOP) 1.5 MG/3DAYS TD Patch 72 Hour     Sig: Place 1 patch onto the skin every 72 hours.     Dispense:  4 patch     Refill:  1   ? fluconazole (DIFLUCAN) 150 MG PO Tablet     Sig: Take 1 tablet by mouth once for 1 dose.     Dispense:  1 tablet     Refill:  1     Orders Placed This Encounter   Procedures   ? COMPREHENSIVE METABOLIC PANEL   ? LIPID PANEL   ? TSH   ? VITAMIN B12   ? CBC with Differential panel result       Health Maintenance was reviewed. The patient's HM Topic list was:                                            Health Maintenance   Topic Date Due   ? Preventive Wellness Visit  02/12/2018 (Originally 10/30/2016)   ? Pap Smear  10/31/2018   ? Basic Metabolic Panel  11/13/2018   ? DTaP,Tdap,and Td Vaccines (7 - Td) 03/29/2019   ? Lipid Profile  11/13/2022   ? USPSTF HIV Risk Assessment  Completed   ? Varicella Vaccine  Completed   ? Influenza Vaccine  Completed     RTC as needed    Scribe Attestation: I, Romero LinerVictoria Crawford, have acted as a Neurosurgeonscribe for Riley NearingAnna Gajda, MD 10:50 AM 11/12/2017     Physician Attestation: I have reviewed and confirmed the information stated by the scribe and made corrections and edits as appropriate.  I have personally provided the services documented by the scribe.      Riley NearingAnna Gajda, MD

## 2017-11-16 NOTE — Progress Notes
History, Medications, Allergies:     Past Medical History:   Diagnosis Date   ? Conjunctivitis, allergic, bilateral      Past Surgical History:   Procedure Laterality Date   ? RHINOPLASTY - PRIMARY, COMPLEX--INCLUDES SEPTOPLASTY       Family History   Problem Relation Age of Onset   ? High Blood Pressure Mother    ? No Known Problems Father    ? Heart Disease Maternal Grandfather    ? Heart Disease Paternal Grandfather      Social History     Social History   ? Marital status: Single     Spouse name: N/A   ? Number of children: N/A   ? Years of education: N/A     Occupational History   ? Not on file.     Social History Main Topics   ? Smoking status: Never Smoker   ? Smokeless tobacco: Never Used   ? Alcohol use No   ? Drug use: No   ? Sexual activity: Yes     Partners: Male     Birth control/ protection: OCP, Condom     Other Topics Concern   ? Not on file     Social History Narrative   ? No narrative on file     Current Outpatient Prescriptions on File Prior to Visit   Medication Sig   ? multivitamin Tablet Take 1 tablet by mouth daily.     No current facility-administered medications on file prior to visit.      Allergies   Allergen Reactions   ? No Known Drug Allergy      No Known Drug Allergies, No Known Drug Allergies       Objective:        VITAL SIGNS (all recorded)      Clinic Vitals     Row Name 11/12/17 1007                Amb Encounter Vitals    Weight 64 kg (141 lb)    -ED at 11/12/17 1008       Height 1.626 m (5\' 4" )    -ED at 11/12/17 1008       BMI (Calculated) 24.25    -ED at 11/12/17 1008       BSA (Calculated - sq m) 1.7    -ED at 11/12/17 1008       BP 120/82    -ED at 11/12/17 1008       BP Location Left upper arm    -ED at 11/12/17 1008       Position Sitting    -ED at 11/12/17 1008       Pulse 68    -ED at 11/12/17 1008       Pulse Quality Normal    -ED at 11/12/17 1008       Resp 16    -ED at 11/12/17 1008       Respiration Quality Normal    -ED at 11/12/17 1008       Temp 37 ?C (98.6 ?F)

## 2017-11-16 NOTE — Progress Notes
-       fluconazole (DIFLUCAN) 150 MG PO Tablet; Take 1 tablet by mouth once for 1 dose.    Vitamin B12 deficiency  -     VITAMIN B12; Future    Other orders  -     NUVARING 0.12-0.015 MG/24HR VA Ring; Place 1 ring vaginally every 30 days.      RTC as scheduled or sooner if no improvement or any other complications.  Patient was advised to contact our office if they have not heard back from us on any testing that was performed or any required referral or ancillary services.  The patient voiced understanding.      Orders Placed This Encounter   Medications   ? diclofenac (VOLTAREN GEL) 1 % TD Gel     Sig: Apply 4 g topically 4 times daily. **Measure dose using enclosed dose card**     Dispense:  100 g     Refill:  3   ? scopolamine (TRANSDERM-SCOP) 1.5 MG/3DAYS TD Patch 72 Hour     Sig: Place 1 patch onto the skin every 72 hours.     Dispense:  4 patch     Refill:  1   ? fluconazole (DIFLUCAN) 150 MG PO Tablet     Sig: Take 1 tablet by mouth once for 1 dose.     Dispense:  1 tablet     Refill:  1     Orders Placed This Encounter   Procedures   ? COMPREHENSIVE METABOLIC PANEL   ? LIPID PANEL   ? TSH   ? VITAMIN B12   ? CBC with Differential panel result       Health Maintenance was reviewed. The patient's HM Topic list was:                                            Health Maintenance   Topic Date Due   ? Preventive Wellness Visit  02/12/2018 (Originally 10/30/2016)   ? Basic Metabolic Panel  02/12/2018 (Originally 09/02/2017)   ? Pap Smear  10/31/2018   ? DTaP,Tdap,and Td Vaccines (7 - Td) 03/29/2019   ? Lipid Profile  09/02/2021   ? USPSTF HIV Risk Assessment  Completed   ? Varicella Vaccine  Completed   ? Influenza Vaccine  Completed     RTC as needed    Scribe Attestation: I, Romero LinerVictoria Crawford, have acted as a Neurosurgeonscribe for Riley NearingAnna Gajda, MD 10:50 AM 11/12/2017     Physician Attestation: Marland Kitchen***

## 2017-11-16 NOTE — Progress Notes
rash noted. She is not diaphoretic. No erythema. No pallor.   Psychiatric: She has a normal mood and affect. Her behavior is normal. Judgment and thought content normal.   Nursing note and vitals reviewed.          Assessment:       ICD-10-CM ICD-9-CM    1. Tendinitis of ankle or foot M77.50 727.06 diclofenac (VOLTAREN GEL) 1 % TD Gel   2. Body mass index (BMI) of 24.0-24.9 in adult Z68.24 V85.1    3. Mixed hyperlipidemia E78.2 272.2 COMPREHENSIVE METABOLIC PANEL      LIPID PANEL      TSH      CBC AND DIFFERENTIAL      COMPREHENSIVE METABOLIC PANEL      LIPID PANEL      TSH      CBC AND DIFFERENTIAL      CBC with Differential panel result   4. Motion sickness, initial encounter T75.3XXA 994.6 scopolamine (TRANSDERM-SCOP) 1.5 MG/3DAYS TD Patch 72 Hour   5. Candidiasis B37.9 112.9 fluconazole (DIFLUCAN) 150 MG PO Tablet   6. Vitamin B12 deficiency E53.8 266.2 VITAMIN B12       Plan:     Ree Shayatalie Henneke was seen today for medication - new, medications refill and follow-up.    Diagnoses and all orders for this visit:    Body mass index (BMI) of 24.0-24.9 in adult    Mixed hyperlipidemia  -     COMPREHENSIVE METABOLIC PANEL; Future  -     LIPID PANEL; Future  -     TSH; Future  -     CBC AND DIFFERENTIAL; Future  -     CBC with Differential panel result    Tendinitis of ankle or foot  -     diclofenac (VOLTAREN GEL) 1 % TD Gel; Apply 4 g topically 4 times daily. **Measure dose using enclosed dose card**    Motion sickness, initial encounter  -     scopolamine (TRANSDERM-SCOP) 1.5 MG/3DAYS TD Patch 72 Hour; Place 1 patch onto the skin every 72 hours.    Candidiasis  -     fluconazole (DIFLUCAN) 150 MG PO Tablet; Take 1 tablet by mouth once for 1 dose.    Vitamin B12 deficiency  -     VITAMIN B12; Future          RTC as scheduled or sooner if no improvement or any other complications.  Patient was advised to contact our office if they have not heard back from

## 2017-11-16 NOTE — Progress Notes
Musculoskeletal: Normal range of motion. She exhibits no edema, tenderness or deformity.   Neurological: She is alert and oriented to person, place, and time. She displays normal reflexes. No cranial nerve deficit or sensory deficit. She exhibits normal muscle tone. Coordination normal.   Skin: Skin is warm and dry. Capillary refill takes less than 2 seconds. No rash noted. She is not diaphoretic. No erythema. No pallor.   Psychiatric: She has a normal mood and affect. Her behavior is normal. Judgment and thought content normal.   Nursing note and vitals reviewed.          Assessment:       ICD-10-CM ICD-9-CM    1. Body mass index (BMI) of 24.0-24.9 in adult Z68.24 V85.1    2. Mixed hyperlipidemia E78.2 272.2 COMPREHENSIVE METABOLIC PANEL      LIPID PANEL      TSH      CBC AND DIFFERENTIAL      COMPREHENSIVE METABOLIC PANEL      LIPID PANEL      TSH      CBC AND DIFFERENTIAL      CBC with Differential panel result   3. Tendinitis of ankle or foot M77.50 727.06 diclofenac (VOLTAREN GEL) 1 % TD Gel   4. Motion sickness, initial encounter T75.3XXA 994.6 scopolamine (TRANSDERM-SCOP) 1.5 MG/3DAYS TD Patch 72 Hour   5. Candidiasis B37.9 112.9 fluconazole (DIFLUCAN) 150 MG PO Tablet   6. Vitamin B12 deficiency E53.8 266.2 VITAMIN B12       Plan:     Abigail Caldwell was seen today for medication - new, medications refill and follow-up.    Diagnoses and all orders for this visit:    Body mass index (BMI) of 24.0-24.9 in adult    Mixed hyperlipidemia  -     COMPREHENSIVE METABOLIC PANEL; Future  -     LIPID PANEL; Future  -     TSH; Future  -     CBC AND DIFFERENTIAL; Future  -     CBC with Differential panel result    Tendinitis of ankle or foot  -     diclofenac (VOLTAREN GEL) 1 % TD Gel; Apply 4 g topically 4 times daily. **Measure dose using enclosed dose card**    Motion sickness, initial encounter  -     scopolamine (TRANSDERM-SCOP) 1.5 MG/3DAYS TD Patch 72 Hour; Place 1 patch onto the skin every 72 hours.    Candidiasis

## 2017-11-16 NOTE — Progress Notes
-  ED at 11/12/17 1008       Temperature Source Oral    -ED at 11/12/17 1008       O2 Saturation 100 %    -ED at 11/12/17 1008       Pain Score Zero    -ED at 11/12/17 1008       Last Menstrual Period 11/07/17    -ED at 11/12/17 1008          Education/Communication Barriers?    Learning/Communication Barriers? No    -ED at 11/12/17 1008          Fall Risk Assessment    Had recent fall / Last 6 months? No recent fall    -ED at 11/12/17 1008       Does patient have a fear of falling? No    -ED at 11/12/17 1008         User Key  (r) = Recorded By, (t) = Taken By, (c) = Cosigned By    [facility] Name Effective Dates    ED Apolonio Schneidersesuyo, Erin, MA 12/04/15 -           Physical Exam   Constitutional: She is oriented to person, place, and time. She appears well-developed and well-nourished. No distress.   HENT:   Head: Normocephalic and atraumatic.   Right Ear: External ear normal.   Left Ear: External ear normal.   Nose: Nose normal.   Mouth/Throat: Oropharynx is clear and moist. No oropharyngeal exudate.   Eyes: Pupils are equal, round, and reactive to light. Conjunctivae and EOM are normal. Right eye exhibits no discharge. Left eye exhibits no discharge. No scleral icterus.   Neck: No JVD present. No thyromegaly present.   Cardiovascular: Normal rate, regular rhythm, normal heart sounds and intact distal pulses.  Exam reveals no gallop and no friction rub.    No murmur heard.  Pulmonary/Chest: Effort normal and breath sounds normal. No respiratory distress. She has no wheezes. She has no rales. She exhibits no tenderness.   Musculoskeletal: Normal range of motion. She exhibits no edema, tenderness or deformity.   Neurological: She is alert and oriented to person, place, and time. She displays normal reflexes. No cranial nerve deficit or sensory deficit. She exhibits normal muscle tone. Coordination normal.   Skin: Skin is warm and dry. Capillary refill takes less than 2 seconds. No

## 2019-05-03 DIAGNOSIS — Z20828 Contact with and (suspected) exposure to other viral communicable diseases: Secondary | ICD-10-CM | POA: Diagnosis not present

## 2019-06-15 DIAGNOSIS — Z20828 Contact with and (suspected) exposure to other viral communicable diseases: Secondary | ICD-10-CM | POA: Diagnosis not present

## 2019-06-16 ENCOUNTER — Ambulatory Visit: Payer: Self-pay | Admitting: Osteopathic Medicine

## 2019-07-11 ENCOUNTER — Encounter: Payer: Self-pay | Admitting: Osteopathic Medicine

## 2019-07-11 ENCOUNTER — Ambulatory Visit (INDEPENDENT_AMBULATORY_CARE_PROVIDER_SITE_OTHER): Payer: 59 | Admitting: Osteopathic Medicine

## 2019-07-11 ENCOUNTER — Other Ambulatory Visit: Payer: Self-pay

## 2019-07-11 VITALS — Ht 64.0 in | Wt 140.0 lb

## 2019-07-11 DIAGNOSIS — Z03818 Encounter for observation for suspected exposure to other biological agents ruled out: Secondary | ICD-10-CM | POA: Diagnosis not present

## 2019-07-11 DIAGNOSIS — Z20822 Contact with and (suspected) exposure to covid-19: Secondary | ICD-10-CM

## 2019-07-11 DIAGNOSIS — M7670 Peroneal tendinitis, unspecified leg: Secondary | ICD-10-CM | POA: Diagnosis not present

## 2019-07-11 DIAGNOSIS — E789 Disorder of lipoprotein metabolism, unspecified: Secondary | ICD-10-CM | POA: Diagnosis not present

## 2019-07-11 DIAGNOSIS — Z20828 Contact with and (suspected) exposure to other viral communicable diseases: Secondary | ICD-10-CM | POA: Diagnosis not present

## 2019-07-11 DIAGNOSIS — F411 Generalized anxiety disorder: Secondary | ICD-10-CM

## 2019-07-11 DIAGNOSIS — Z0000 Encounter for general adult medical examination without abnormal findings: Secondary | ICD-10-CM

## 2019-07-11 NOTE — Progress Notes (Signed)
Virtual Visit via Video (App used: Doximity) Note  I connected with      Tracy Schneider on 07/11/19 at@ by a telemedicine application and verified that I am speaking with the correct person using two identifiers.  Patient is in her car  I am in office   I discussed the limitations of evaluation and management by telemedicine and the availability of in person appointments. The patient expressed understanding and agreed to proceed.  History of Present Illness: Tracy Schneider is a 28 y.o. female who would like to discuss  New to establish care    Foot tendinitis: Peroneal tendonitis has bothered her in the past, no specialists seen for this.   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  Not interested in starting medications at this time, would like referral to counselor/therapist.  COVID: Was exposed at work, fairly close contact with another employee was not wearing their mask when talking to her. D/w Health at Work, they said she did not need Covid testing, patient got Covid testing anyway today at [facility].  Currently, no symptoms  Cholesterol:  TC 217, TG 153, HDL 50, LDL 140 Has some concerns about borderline cholesterol     Observations/Objective: Ht 5\' 4"  (1.626 m)   Wt 140 lb (63.5 kg)   LMP 06/24/2019   BMI 24.03 kg/m  BP Readings from Last 3 Encounters:  No data found for BP   Exam: Normal Speech.  NAD  Lab and Radiology Results No results found for this or any previous visit (from the past 72 hour(s)). No results found.     Assessment and Plan: 29 y.o. female with The primary encounter diagnosis was Borderline high cholesterol. Diagnoses of Anxiety state, Close exposure to COVID-19 virus, and Peroneal tendinitis, unspecified laterality were also pertinent to this visit.   PDMP not reviewed this encounter. Orders Placed This Encounter  Procedures  . Lipid panel  . Ambulatory referral to Behavioral Health    Referral  Priority:   Routine    Referral Type:   Psychiatric    Referral Reason:   Specialty Services Required    Requested Specialty:   Behavioral Health    Number of Visits Requested:   1   No orders of the defined types were placed in this encounter.  Patient Instructions  Peroneal Tendon Rehab Ask your health care provider which exercises are safe for you. Do exercises exactly as told by your health care provider and adjust them as directed. It is normal to feel mild stretching, pulling, tightness, or discomfort as you do these exercises. Stop right away if you feel sudden pain or your pain gets worse. Do not begin these exercises until told by your health care provider. Stretching and range-of-motion exercises These exercises warm up your muscles and joints and improve the movement and flexibility of your ankle. These exercises also help to relieve pain and stiffness. Passive ankle plantar flexion  1. Sit with your left / right leg crossed over your opposite knee. 2. With your left / right hand, pull the front of your foot and toes toward you (plantar flexion). You should feel a gentle stretch on the top of your foot and ankle. 3. Hold this position for __________ seconds. Repeat __________ times. Complete this exercise __________ times a day. Ankle alphabet  1. Sit with your left / right leg supported at the lower leg. ? Do not rest your foot on anything. ? Make sure your  foot has room to move freely. 2. Think of your left / right foot as a paintbrush, and move your foot to trace each letter of the alphabet in the air. Keep your hip and knee still while you trace the letters. 3. Trace every letter of the alphabet. Repeat __________ times. Complete this exercise __________ times a day. Gastrocnemius stretch, standing This exercise is also called a calf stretch. It stretches the muscles in the back of the upper calf. 1. Stand with your hands against a wall. 2. Extend your left / right leg  behind you, and bend your front knee slightly. Your heels should be on the floor. 3. Keeping your heels on the floor and your back knee straight, shift your weight toward the wall. Do not arch your back. You should feel a gentle stretch in the back of your lower leg (gastrocnemius). 4. Hold this position for __________ seconds. Repeat __________ times. Complete this exercise __________ times a day. Soleus stretch, standing This exercise is also called a calf stretch. It stretches the muscles in the back of the lower calf. 1. Stand with your hands against a wall. 2. Extend your left / right leg behind you, and bend your front knee slightly. Your heels should be on the floor. 3. Keeping your heels on the floor, bend your back knee and shift your weight slightly over your back leg. You should feel a gentle stretch deep in your calf (soleus). 4. Hold this position for __________ seconds. Repeat __________ times. Complete this exercise __________ times a day. Strengthening exercises These exercises help to build strength and endurance in your toes, foot, and ankle. Endurance is the ability to use your muscles for a long time, even after they get tired. Towel curls  1. Sit in a chair on a non-carpeted surface, and put your feet on the floor. 2. Place a towel in front of your feet. If told by your health care provider, add __________ to the end of the towel. 3. Keeping your heel on the floor, put your left / right toes on the towel. 4. Pull the towel toward you by grabbing the towel with your toes and curling them under. Keep your heel on the floor. 5. Let your toes relax. 6. Grab the towel again. Keep going until the towel is completely underneath your foot. Repeat __________ times. Complete this exercise __________ times a day. Isometric eversion 1. Sit in a chair with the outer edge of your left / right foot against a sturdy object, such as a wall or a table leg. 2. Slowly push the outer edge of  your foot out against the wall or table leg (eversion). Your foot should not rotate (isometric). 3. Hold this position for __________ seconds. Repeat __________ times. Complete this exercise __________ times a day. This information is not intended to replace advice given to you by your health care provider. Make sure you discuss any questions you have with your health care provider. Document Released: 08/18/2005 Document Revised: 12/09/2018 Document Reviewed: 10/28/2018 Elsevier Patient Education  2020 [employer].    Instructions sent via MyChart. If MyChart not available, pt was given option for info via personal e-mail w/ no guarantee of protected health info over unsecured e-mail communication, and MyChart sign-up instructions were sent to patient.   Follow Up Instructions: Return in about 6 months (around 01/08/2020) for [place] (get labs prior to visit, orders are in).  Labs ordered for future visit. Annual physical / preventive care was NOT  performed or billed today.     I discussed the assessment and treatment plan with the patient. The patient was provided an opportunity to ask questions and all were answered. The patient agreed with the plan and demonstrated an understanding of the instructions.   The patient was advised to call back or seek an in-person evaluation if any new concerns, if symptoms worsen or if the condition fails to improve as anticipated.  25 minutes of non-face-to-face time was provided during this encounter.      . . . . . . . . . . . . . Marland Kitchen.                   Historical information moved to improve visibility of documentation.  History reviewed. No pertinent past medical history. Past Surgical History:  Procedure Laterality Date  . RHINOPLASTY  10/2005   septorhinoplasty   Social History   Tobacco Use  . Smoking status: Never Smoker  . Smokeless tobacco: Never Used  Substance Use Topics  . Alcohol use: Not Currently    family history includes Colon cancer in her paternal grandfather; High blood pressure in her maternal grandmother; Stroke in her paternal grandmother.  Medications: No current outpatient medications on file.   No current facility-administered medications for this visit.    Not on File

## 2019-07-11 NOTE — Patient Instructions (Signed)
Peroneal Tendon Rehab Ask your health care provider which exercises are safe for you. Do exercises exactly as told by your health care provider and adjust them as directed. It is normal to feel mild stretching, pulling, tightness, or discomfort as you do these exercises. Stop right away if you feel sudden pain or your pain gets worse. Do not begin these exercises until told by your health care provider. Stretching and range-of-motion exercises These exercises warm up your muscles and joints and improve the movement and flexibility of your ankle. These exercises also help to relieve pain and stiffness. Passive ankle plantar flexion  1. Sit with your left / right leg crossed over your opposite knee. 2. With your left / right hand, pull the front of your foot and toes toward you (plantar flexion). You should feel a gentle stretch on the top of your foot and ankle. 3. Hold this position for __________ seconds. Repeat __________ times. Complete this exercise __________ times a day. Ankle alphabet  1. Sit with your left / right leg supported at the lower leg. ? Do not rest your foot on anything. ? Make sure your foot has room to move freely. 2. Think of your left / right foot as a paintbrush, and move your foot to trace each letter of the alphabet in the air. Keep your hip and knee still while you trace the letters. 3. Trace every letter of the alphabet. Repeat __________ times. Complete this exercise __________ times a day. Gastrocnemius stretch, standing This exercise is also called a calf stretch. It stretches the muscles in the back of the upper calf. 1. Stand with your hands against a wall. 2. Extend your left / right leg behind you, and bend your front knee slightly. Your heels should be on the floor. 3. Keeping your heels on the floor and your back knee straight, shift your weight toward the wall. Do not arch your back. You should feel a gentle stretch in the back of your lower leg  (gastrocnemius). 4. Hold this position for __________ seconds. Repeat __________ times. Complete this exercise __________ times a day. Soleus stretch, standing This exercise is also called a calf stretch. It stretches the muscles in the back of the lower calf. 1. Stand with your hands against a wall. 2. Extend your left / right leg behind you, and bend your front knee slightly. Your heels should be on the floor. 3. Keeping your heels on the floor, bend your back knee and shift your weight slightly over your back leg. You should feel a gentle stretch deep in your calf (soleus). 4. Hold this position for __________ seconds. Repeat __________ times. Complete this exercise __________ times a day. Strengthening exercises These exercises help to build strength and endurance in your toes, foot, and ankle. Endurance is the ability to use your muscles for a long time, even after they get tired. Towel curls  1. Sit in a chair on a non-carpeted surface, and put your feet on the floor. 2. Place a towel in front of your feet. If told by your health care provider, add __________ to the end of the towel. 3. Keeping your heel on the floor, put your left / right toes on the towel. 4. Pull the towel toward you by grabbing the towel with your toes and curling them under. Keep your heel on the floor. 5. Let your toes relax. 6. Grab the towel again. Keep going until the towel is completely underneath your foot. Repeat __________ times.  Complete this exercise __________ times a day. Isometric eversion 1. Sit in a chair with the outer edge of your left / right foot against a sturdy object, such as a wall or a table leg. 2. Slowly push the outer edge of your foot out against the wall or table leg (eversion). Your foot should not rotate (isometric). 3. Hold this position for __________ seconds. Repeat __________ times. Complete this exercise __________ times a day. This information is not intended to replace advice  given to you by your health care provider. Make sure you discuss any questions you have with your health care provider. Document Released: 08/18/2005 Document Revised: 12/09/2018 Document Reviewed: 10/28/2018 Elsevier Patient Education  2020 [employer].

## 2019-08-05 DIAGNOSIS — R519 Headache, unspecified: Secondary | ICD-10-CM | POA: Diagnosis not present

## 2019-08-05 DIAGNOSIS — J029 Acute pharyngitis, unspecified: Secondary | ICD-10-CM | POA: Diagnosis not present

## 2019-08-05 DIAGNOSIS — R5383 Other fatigue: Secondary | ICD-10-CM | POA: Diagnosis not present

## 2019-08-05 DIAGNOSIS — Z1159 Encounter for screening for other viral diseases: Secondary | ICD-10-CM | POA: Diagnosis not present

## 2019-08-12 ENCOUNTER — Ambulatory Visit: Payer: Self-pay | Admitting: Professional

## 2019-08-18 ENCOUNTER — Telehealth: Payer: 59 | Admitting: Physician Assistant

## 2019-08-18 DIAGNOSIS — N3000 Acute cystitis without hematuria: Secondary | ICD-10-CM | POA: Diagnosis not present

## 2019-08-18 MED ORDER — CEPHALEXIN 500 MG PO CAPS: 500.0000 mg | ORAL_CAPSULE | Freq: Two times a day (BID) | ORAL | 0 refills | Status: DC

## 2019-08-18 NOTE — Progress Notes (Signed)

## 2019-08-22 ENCOUNTER — Ambulatory Visit (INDEPENDENT_AMBULATORY_CARE_PROVIDER_SITE_OTHER): Payer: 59 | Admitting: Medical-Surgical

## 2019-08-22 ENCOUNTER — Encounter: Payer: Self-pay | Admitting: Osteopathic Medicine

## 2019-08-22 ENCOUNTER — Other Ambulatory Visit: Payer: Self-pay

## 2019-08-22 ENCOUNTER — Encounter: Payer: Self-pay | Admitting: Medical-Surgical

## 2019-08-22 VITALS — BP 124/67 | HR 73 | Temp 98.0°F | Ht 64.0 in | Wt 141.1 lb

## 2019-08-22 DIAGNOSIS — N3000 Acute cystitis without hematuria: Secondary | ICD-10-CM | POA: Diagnosis not present

## 2019-08-22 DIAGNOSIS — R300 Dysuria: Secondary | ICD-10-CM

## 2019-08-22 LAB — POCT URINALYSIS DIP (CLINITEK)
Bilirubin, UA: NEGATIVE
Blood, UA: NEGATIVE
Glucose, UA: NEGATIVE mg/dL
Ketones, POC UA: NEGATIVE mg/dL
Leukocytes, UA: NEGATIVE
Nitrite, UA: NEGATIVE
POC PROTEIN,UA: NEGATIVE
Spec Grav, UA: 1.01 (ref 1.010–1.025)
Urobilinogen, UA: 0.2 E.U./dL
pH, UA: 5.5 (ref 5.0–8.0)

## 2019-08-22 MED ORDER — CIPROFLOXACIN HCL 500 MG PO TABS: 500.0000 mg | ORAL_TABLET | Freq: Two times a day (BID) | ORAL | 0 refills | Status: AC

## 2019-08-22 NOTE — Progress Notes (Signed)
Subjective:    CC: Dysuria  HPI:   Pleasant 28 year old female in with complaints of continuing urinary symptoms including frequency, urgency, and pressure/pain.  Seen via E visit 4 days ago and prescribed Keflex for acute cystitis.  Now on day 4 prescription with only mild improvement in symptoms.  Has noticed no fever, chills, hematuria.  Has been pushing fluids as much as possible.  Took AZO for the first 2 days of symptoms but has since stopped.  Will be leaving for travel to [place] in the next week and is concerned with continued symptoms.   I reviewed the past medical history, family history, social history, surgical history, and allergies today and no changes were needed.  Please see the problem list section below in epic for further details.  Past Medical History: History reviewed. No pertinent past medical history. Past Surgical History: Past Surgical History:  Procedure Laterality Date  . RHINOPLASTY  10/2005   septorhinoplasty   Social History: Social History   Socioeconomic History  . Marital status: Single    Spouse name: Not on file  . Number of children: Not on file  . Years of education: Not on file  . Highest education level: Not on file  Occupational History    Employer: [employer]  Tobacco Use  . Smoking status: Never Smoker  . Smokeless tobacco: Never Used  Substance and Sexual Activity  . Alcohol use: Not Currently  . Drug use: Never  . Sexual activity: Yes    Partners: Male    Birth control/protection: Inserts  Other Topics Concern  . Not on file  Social History Narrative  . Not on file   Social Determinants of Health   Financial Resource Strain:   . Difficulty of Paying Living Expenses: Not on file  Food Insecurity:   . Worried About Programme researcher, broadcasting/film/video in the Last Year: Not on file  . Ran Out of Food in the Last Year: Not on file  Transportation Needs:   . Lack of Transportation (Medical): Not on file  . Lack of Transportation  (Non-Medical): Not on file  Physical Activity:   . Days of Exercise per Week: Not on file  . Minutes of Exercise per Session: Not on file  Stress:   . Feeling of Stress : Not on file  Social Connections:   . Frequency of Communication with Friends and Family: Not on file  . Frequency of Social Gatherings with Friends and Family: Not on file  . Attends Religious Services: Not on file  . Active Member of Clubs or Organizations: Not on file  . Attends Banker Meetings: Not on file  . Marital Status: Not on file   Family History: Family History  Problem Relation Age of Onset  . High blood pressure Maternal Grandmother   . Stroke Paternal Grandmother   . Colon cancer Paternal Grandfather    Allergies: No Known Allergies Medications: See med rec.  Review of Systems: No fevers, chills, night sweats, weight loss, chest pain, or shortness of breath.   Objective:    General: Well Developed, well nourished, and in no acute distress.  Neuro: Alert and oriented x3.  HEENT: Normocephalic, atraumatic, pupils equal round reactive to light, neck supple, no masses, no lymphadenopathy, thyroid nonpalpable.  Skin: Warm and dry. Cardiac: Regular rate and rhythm, no murmurs rubs or gallops, no lower extremity edema.  Respiratory: Clear to auscultation bilaterally. Not using accessory muscles, speaking in full sentences. Abdomen: Soft, nondistended.  Tenderness  over suprapubic area to moderate palpation. Musculoskeletal: No CVA tenderness.   Impression and Recommendations:    Dysuria: Urinalysis clear today but sending for culture.  Given only mild improvement in symptoms since starting Keflex, discussed switching to Cipro.  Prescription sent to pharmacy for 7-day course of Cipro for use pending culture results or if symptoms not completely resolved by the end of Keflex therapy.   ___________________________________________ Clearnce Sorrel, DNP, APRN, FNP-BC [facility]

## 2019-08-23 LAB — URINE CULTURE
MICRO NUMBER:: 1218721
Result:: NO GROWTH
SPECIMEN QUALITY:: ADEQUATE

## 2019-08-23 MED ORDER — FLUCONAZOLE 150 MG PO TABS: 150.0000 mg | ORAL_TABLET | Freq: Once | ORAL | 1 refills | Status: AC

## 2019-08-23 NOTE — Telephone Encounter (Signed)
Pended RX.   Please review and send if OK

## 2019-09-09 ENCOUNTER — Ambulatory Visit (INDEPENDENT_AMBULATORY_CARE_PROVIDER_SITE_OTHER): Payer: No Typology Code available for payment source | Admitting: Professional

## 2019-09-09 DIAGNOSIS — F411 Generalized anxiety disorder: Secondary | ICD-10-CM | POA: Diagnosis not present

## 2019-09-12 ENCOUNTER — Encounter: Payer: Self-pay | Admitting: Osteopathic Medicine

## 2019-09-12 DIAGNOSIS — Z0100 Encounter for examination of eyes and vision without abnormal findings: Secondary | ICD-10-CM

## 2019-09-13 NOTE — Telephone Encounter (Signed)
Pended. Please sign if OK

## 2019-09-15 ENCOUNTER — Ambulatory Visit (INDEPENDENT_AMBULATORY_CARE_PROVIDER_SITE_OTHER): Payer: No Typology Code available for payment source | Admitting: Professional

## 2019-09-15 DIAGNOSIS — F411 Generalized anxiety disorder: Secondary | ICD-10-CM

## 2019-09-19 ENCOUNTER — Encounter: Payer: Self-pay | Admitting: Osteopathic Medicine

## 2019-09-19 DIAGNOSIS — Z0100 Encounter for examination of eyes and vision without abnormal findings: Secondary | ICD-10-CM

## 2019-09-20 NOTE — Telephone Encounter (Signed)
FYI I placed referral  

## 2019-09-22 ENCOUNTER — Ambulatory Visit (INDEPENDENT_AMBULATORY_CARE_PROVIDER_SITE_OTHER): Payer: No Typology Code available for payment source | Admitting: Professional

## 2019-09-22 DIAGNOSIS — F411 Generalized anxiety disorder: Secondary | ICD-10-CM

## 2019-09-30 ENCOUNTER — Ambulatory Visit (INDEPENDENT_AMBULATORY_CARE_PROVIDER_SITE_OTHER): Payer: No Typology Code available for payment source | Admitting: Professional

## 2019-09-30 DIAGNOSIS — F411 Generalized anxiety disorder: Secondary | ICD-10-CM | POA: Diagnosis not present

## 2019-10-07 ENCOUNTER — Ambulatory Visit (INDEPENDENT_AMBULATORY_CARE_PROVIDER_SITE_OTHER): Payer: No Typology Code available for payment source | Admitting: Professional

## 2019-10-07 DIAGNOSIS — F411 Generalized anxiety disorder: Secondary | ICD-10-CM

## 2019-10-14 ENCOUNTER — Ambulatory Visit: Payer: No Typology Code available for payment source | Admitting: Professional

## 2019-10-21 ENCOUNTER — Ambulatory Visit: Payer: No Typology Code available for payment source | Admitting: Professional

## 2019-10-28 ENCOUNTER — Ambulatory Visit (INDEPENDENT_AMBULATORY_CARE_PROVIDER_SITE_OTHER): Payer: No Typology Code available for payment source | Admitting: Professional

## 2019-10-28 DIAGNOSIS — F411 Generalized anxiety disorder: Secondary | ICD-10-CM | POA: Diagnosis not present

## 2019-10-28 DIAGNOSIS — F331 Major depressive disorder, recurrent, moderate: Secondary | ICD-10-CM

## 2019-11-04 ENCOUNTER — Ambulatory Visit (INDEPENDENT_AMBULATORY_CARE_PROVIDER_SITE_OTHER): Payer: No Typology Code available for payment source | Admitting: Professional

## 2019-11-04 DIAGNOSIS — F411 Generalized anxiety disorder: Secondary | ICD-10-CM | POA: Diagnosis not present

## 2019-11-11 ENCOUNTER — Ambulatory Visit: Payer: No Typology Code available for payment source | Admitting: Professional

## 2019-11-18 ENCOUNTER — Ambulatory Visit (INDEPENDENT_AMBULATORY_CARE_PROVIDER_SITE_OTHER): Payer: No Typology Code available for payment source | Admitting: Professional

## 2019-11-18 DIAGNOSIS — F411 Generalized anxiety disorder: Secondary | ICD-10-CM

## 2019-11-25 ENCOUNTER — Ambulatory Visit (INDEPENDENT_AMBULATORY_CARE_PROVIDER_SITE_OTHER): Payer: No Typology Code available for payment source | Admitting: Professional

## 2019-11-25 DIAGNOSIS — F411 Generalized anxiety disorder: Secondary | ICD-10-CM

## 2019-12-02 ENCOUNTER — Ambulatory Visit (INDEPENDENT_AMBULATORY_CARE_PROVIDER_SITE_OTHER): Payer: No Typology Code available for payment source | Admitting: Professional

## 2019-12-02 DIAGNOSIS — F411 Generalized anxiety disorder: Secondary | ICD-10-CM

## 2019-12-08 ENCOUNTER — Ambulatory Visit: Payer: No Typology Code available for payment source | Admitting: Professional

## 2019-12-16 ENCOUNTER — Ambulatory Visit (INDEPENDENT_AMBULATORY_CARE_PROVIDER_SITE_OTHER): Payer: No Typology Code available for payment source | Admitting: Professional

## 2019-12-16 DIAGNOSIS — F411 Generalized anxiety disorder: Secondary | ICD-10-CM | POA: Diagnosis not present

## 2019-12-23 ENCOUNTER — Ambulatory Visit (INDEPENDENT_AMBULATORY_CARE_PROVIDER_SITE_OTHER): Payer: No Typology Code available for payment source | Admitting: Professional

## 2019-12-23 DIAGNOSIS — F411 Generalized anxiety disorder: Secondary | ICD-10-CM | POA: Diagnosis not present

## 2020-01-06 ENCOUNTER — Encounter: Payer: Self-pay | Admitting: Nurse Practitioner

## 2020-01-06 ENCOUNTER — Ambulatory Visit (INDEPENDENT_AMBULATORY_CARE_PROVIDER_SITE_OTHER): Payer: No Typology Code available for payment source | Admitting: Nurse Practitioner

## 2020-01-06 ENCOUNTER — Other Ambulatory Visit: Payer: Self-pay

## 2020-01-06 VITALS — BP 121/75 | HR 73 | Ht 64.0 in | Wt 137.0 lb

## 2020-01-06 DIAGNOSIS — R200 Anesthesia of skin: Secondary | ICD-10-CM

## 2020-01-06 DIAGNOSIS — R202 Paresthesia of skin: Secondary | ICD-10-CM | POA: Diagnosis not present

## 2020-01-06 NOTE — Progress Notes (Signed)
Acute Office Visit  Subjective:    Patient ID: Tracy Schneider, female    DOB: 12-09-1990, 29 y.o.   MRN: [id number]  Chief Complaint  Patient presents with  . Tingling    HPI Patient is in today for tingling sensation in her fingers and hands bilaterally that has been ongoing for about 3 weeks. The sensation is not constant, it seems to come and go, but is not consistent with any activity, time of day, or movement. 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 She did take a week off of work and noticed that it did not improve.  She denies numbness or tingling in her legs or feet, forearms, upper arms, neck pain, back pain, neck or back injury, or weakness.   History reviewed. No pertinent past medical history.  Past Surgical History:  Procedure Laterality Date  . RHINOPLASTY  10/2005   septorhinoplasty    Family History  Problem Relation Age of Onset  . High blood pressure Maternal Grandmother   . Stroke Paternal Grandmother   . Colon cancer Paternal Grandfather     Social History   Socioeconomic History  . Marital status: Single    Spouse name: Not on file  . Number of children: Not on file  . Years of education: Not on file  . Highest education level: Not on file  Occupational History    Employer: [employer]  Tobacco Use  . Smoking status: Never Smoker  . Smokeless tobacco: Never Used  Substance and Sexual Activity  . Alcohol use: Not Currently  . Drug use: Never  . Sexual activity: Yes    Partners: Male    Birth control/protection: Inserts  Other Topics Concern  . Not on file  Social History Narrative  . Not on file   Social Determinants of Health   Financial Resource Strain:   . Difficulty of Paying Living Expenses:   Food Insecurity:   . Worried About Charity fundraiser in the Last Year:   . Arboriculturist in the Last Year:   Transportation Needs:   . Lexicographer (Medical):   Marland Kitchen Lack of Transportation (Non-Medical):   Physical Activity:   . Days of Exercise per Week:   . Minutes of Exercise per Session:   Stress:   . Feeling of Stress :   Social Connections:   . Frequency of Communication with Friends and Family:   . Frequency of Social Gatherings with Friends and Family:   . Attends Religious Services:   . Active Member of Clubs or Organizations:   . Attends Archivist Meetings:   Marland Kitchen Marital Status:   Intimate Partner Violence:   . Fear of Current or Ex-Partner:   . Emotionally Abused:   Marland Kitchen Physically Abused:   . Sexually Abused:     Outpatient Medications Prior to Visit  Medication Sig Dispense Refill  . etonogestrel-ethinyl estradiol (NUVARING) 0.12-0.015 MG/24HR vaginal ring     . cephALEXin (KEFLEX) 500 MG capsule Take 1 capsule (500 mg total) by mouth 2 (two) times daily. 14 capsule 0   No facility-administered medications prior to visit.    No Known Allergies  Review of Systems  Constitutional: Negative for chills, fatigue and fever.  Musculoskeletal: Negative for arthralgias, back pain, gait problem, joint swelling, myalgias and neck pain.  Skin: Negative for color change and pallor.  Neurological: Negative for tremors  and weakness.       Objective:    Physical Exam Vitals and nursing note reviewed.  Constitutional:      Appearance: Normal appearance. She is normal weight.  HENT:     Head: Normocephalic.  Eyes:     Extraocular Movements: Extraocular movements intact.     Conjunctiva/sclera: Conjunctivae normal.     Pupils: Pupils are equal, round, and reactive to light.  Cardiovascular:     Rate and Rhythm: Normal rate.     Pulses: Normal pulses.  Pulmonary:     Effort: Pulmonary effort is normal.  Musculoskeletal:        General: Normal range of motion.     Right forearm: No swelling or tenderness.     Left forearm: No swelling or tenderness.     Right wrist: No swelling, deformity or  tenderness. Normal range of motion. Normal pulse.     Left wrist: No swelling, deformity or tenderness. Normal range of motion. Normal pulse.     Right hand: No swelling, deformity or tenderness. Normal range of motion. Normal strength. Normal sensation. Normal capillary refill. Normal pulse.     Left hand: No swelling, deformity or tenderness. Normal range of motion. Normal strength. Normal sensation. Normal capillary refill. Normal pulse.     Cervical back: Normal range of motion. No rigidity or tenderness.     Right lower leg: No edema.     Left lower leg: No edema.  Skin:    Capillary Refill: Capillary refill takes less than 2 seconds.  Neurological:     General: No focal deficit present.     Mental Status: She is alert and oriented to person, place, and time.     Sensory: Sensation is intact. No sensory deficit.     Motor: Motor function is intact. No weakness.     Coordination: Coordination is intact. Coordination normal.     Comments: Positive Phalen's test bilaterally.   Psychiatric:        Mood and Affect: Mood normal.        Behavior: Behavior normal.        Thought Content: Thought content normal.        Judgment: Judgment normal.     BP 121/75   Pulse 73   Ht '5\' 4"'$  (1.626 m)   Wt 137 lb (62.1 kg)   SpO2 100%   BMI 23.52 kg/m  Wt Readings from Last 3 Encounters:  01/06/20 137 lb (62.1 kg)  08/22/19 141 lb 1.9 oz (64 kg)  07/11/19 140 lb (63.5 kg)    Health Maintenance Due  Topic Date Due  . HIV Screening  Never done  . PAP-Cervical Cytology Screening  Never done  . PAP SMEAR-Modifier  Never done    There are no preventive care reminders to display for this patient.   No results found for: TSH No results found for: WBC, HGB, HCT, MCV, PLT No results found for: NA, K, CHLORIDE, CO2, GLUCOSE, BUN, CREATININE, BILITOT, ALKPHOS, AST, ALT, PROT, ALBUMIN, CALCIUM, ANIONGAP, EGFR, GFR No results found for: CHOL No results found for: HDL No results found for:  LDLCALC No results found for: TRIG No results found for: CHOLHDL No results found for: HGBA1C     Assessment & Plan:   1. Numbness and tingling in both hands Numbness and tingling bilaterally in the fingers and hands.  It is most likely that Karisa is experiencing symptoms related to carpal tunnel syndrome however we will obtain labs today to  rule out other etiologies including diabetes, B12 deficiency, anemias, and electrolyte imbalances. Plan to utilize bilateral wrist bracing at night for the next 4 to 6 weeks to determine if this helps with improvement of symptoms.  If symptoms are not improved will consider further testing, imagining, or evaluation by sports medicine. Will notify patient of results to laboratory values and make changes to plan of care appropriate based on findings. Information provided on paresthesias and carpal tunnel syndrome to the patient. Follow-up in approximately 4 to 6 weeks or sooner if symptoms worsen or change.  - CBC - COMPLETE METABOLIC PANEL WITH GFR - Vitamin B12 - Hemoglobin A1c   Orma Render, NP

## 2020-01-06 NOTE — Patient Instructions (Signed)
Wear the braces at bedtime for the next 4-6 weeks to see if this helps with any of your symptoms. If things do not improve or if your symptoms worsen or spread to any other location, please let us know.   I will let you know what your labs say and if anything is abnormal, we can follow-up with that.   Paresthesia Paresthesia is an abnormal burning or prickling sensation. It is usually felt in the hands, arms, legs, or feet. However, it may occur in any part of the body. Usually, paresthesia is not painful. It may feel like:  Tingling or numbness.  Buzzing.  Itching. Paresthesia may occur without any clear cause, or it may be caused by:  Breathing too quickly (hyperventilation).  Pressure on a nerve.  An underlying medical condition.  Side effects of a medication.  Nutritional deficiencies.  Exposure to toxic chemicals. Most people experience temporary (transient) paresthesia at some time in their lives. For some people, it may be long-lasting (chronic) because of an underlying medical condition. If you have paresthesia that lasts a long time, you may need to be evaluated by your health care provider. Follow these instructions at home: Alcohol use   Do not drink alcohol if: ? Your health care provider tells you not to drink. ? You are pregnant, may be pregnant, or are planning to become pregnant.  If you drink alcohol: ? Limit how much you use to:  0-1 drink a day for women.  0-2 drinks a day for men. ? Be aware of how much alcohol is in your drink. In the [place] one drink equals one 12 oz bottle of beer (355 mL), one 5 oz glass of wine (148 mL), or one 1 oz glass of hard liquor (44 mL). Nutrition   Eat a healthy diet. This includes: ? Eating foods that are high in fiber, such as fresh fruits and vegetables, whole grains, and beans. ? Limiting foods that are high in fat and processed sugars, such as fried or sweet foods. General instructions  Take over-the-counter  and prescription medicines only as told by your health care provider.  Do not use any products that contain nicotine or tobacco, such as cigarettes and e-cigarettes. These can keep blood from reaching damaged nerves. If you need help quitting, ask your health care provider.  If you have diabetes, work closely with your health care provider to keep your blood sugar under control.  If you have numbness in your feet: ? Check every day for signs of injury or infection. Watch for redness, warmth, and swelling. ? Wear padded socks and comfortable shoes. These help protect your feet.  Keep all follow-up visits as told by your health care provider. This is important. Contact a health care provider if you:  Have paresthesia that gets worse or does not go away.  Have a burning or prickling feeling that gets worse when you walk.  Have pain, cramps, or dizziness.  Develop a rash. Get help right away if you:  Feel weak.  Have trouble walking or moving.  Have problems with speech, understanding, or vision.  Feel confused.  Cannot control your bladder or bowel movements.  Have numbness after an injury.  Develop new weakness in an arm or leg.  Faint. Summary  Paresthesia is an abnormal burning or prickling sensation that is usually felt in the hands, arms, legs, or feet. It may also occur in other parts of the body.  Paresthesia may occur without any clear cause,  or it may be caused by breathing too quickly (hyperventilation), pressure on a nerve, an underlying medical condition, side effects of a medication, nutritional deficiencies, or exposure to toxic chemicals.  If you have paresthesia that lasts a long time, you may need to be evaluated by your health care provider. This information is not intended to replace advice given to you by your health care provider. Make sure you discuss any questions you have with your health care provider. Document Revised: 09/13/2018 Document Reviewed:  08/27/2017 Elsevier Patient Education  2020 [employer] Carpal Tunnel Syndrome  Carpal tunnel syndrome is a condition that causes pain in your hand and arm. The carpal tunnel is a narrow area located on the palm side of your wrist. Repeated wrist motion or certain diseases may cause swelling within the tunnel. This swelling pinches the main nerve in the wrist (median nerve). What are the causes? This condition may be caused by:  Repeated wrist motions.  Wrist injuries.  Arthritis.  A cyst or tumor in the carpal tunnel.  Fluid buildup during pregnancy. Sometimes the cause of this condition is not known. What increases the risk? The following factors may make you more likely to develop this condition:  Having a job, such as being a Haematologist, that requires you to repeatedly move your wrist in the same motion.  Being a woman.  Having certain conditions, such as: ? Diabetes. ? Obesity. ? An underactive thyroid (hypothyroidism). ? Kidney failure. What are the signs or symptoms? Symptoms of this condition include:  A tingling feeling in your fingers, especially in your thumb, index, and middle fingers.  Tingling or numbness in your hand.  An aching feeling in your entire arm, especially when your wrist and elbow are bent for a long time.  Wrist pain that goes up your arm to your shoulder.  Pain that goes down into your palm or fingers.  A weak feeling in your hands. You may have trouble grabbing and holding items. Your symptoms may feel worse during the night. How is this diagnosed? This condition is diagnosed with a medical history and physical exam. You may also have tests, including:  Electromyogram (EMG). This test measures electrical signals sent by your nerves into the muscles.  Nerve conduction study. This test measures how well electrical signals pass through your nerves.  Imaging tests, such as X-rays, ultrasound, and MRI. These tests check for  possible causes of your condition. How is this treated? This condition may be treated with:  Lifestyle changes. It is important to stop or change the activity that caused your condition.  Doing exercise and activities to strengthen your muscles and bones (physical therapy).  Learning how to use your hand again after diagnosis (occupational therapy).  Medicines for pain and inflammation. This may include medicine that is injected into your wrist.  A wrist splint.  Surgery. Follow these instructions at home: If you have a splint:  Wear the splint as told by your health care provider. Remove it only as told by your health care provider.  Loosen the splint if your fingers tingle, become numb, or turn cold and blue.  Keep the splint clean.  If the splint is not waterproof: ? Do not let it get wet. ? Cover it with a watertight covering when you take a bath or shower. Managing pain, stiffness, and swelling   If directed, put ice on the painful area: ? If you have a removable splint, remove it as told by  your health care provider. ? Put ice in a plastic bag. ? Place a towel between your skin and the bag. ? Leave the ice on for 20 minutes, 2-3 times per day. General instructions  Take over-the-counter and prescription medicines only as told by your health care provider.  Rest your wrist from any activity that may be causing your pain. If your condition is work related, talk with your employer about changes that can be made, such as getting a wrist pad to use while typing.  Do any exercises as told by your health care provider, physical therapist, or occupational therapist.  Keep all follow-up visits as told by your health care provider. This is important. Contact a health care provider if:  You have new symptoms.  Your pain is not controlled with medicines.  Your symptoms get worse. Get help right away if:  You have severe numbness or tingling in your wrist or hand.  Summary  Carpal tunnel syndrome is a condition that causes pain in your hand and arm.  It is usually caused by repeated wrist motions.  Lifestyle changes and medicines are used to treat carpal tunnel syndrome. Surgery may be recommended.  Follow your health care provider's instructions about wearing a splint, resting from activity, keeping follow-up visits, and calling for help. This information is not intended to replace advice given to you by your health care provider. Make sure you discuss any questions you have with your health care provider. Document Revised: 12/25/2017 Document Reviewed: 12/25/2017 Elsevier Patient Education  2020 [employer].

## 2020-01-07 ENCOUNTER — Ambulatory Visit (INDEPENDENT_AMBULATORY_CARE_PROVIDER_SITE_OTHER): Payer: No Typology Code available for payment source | Admitting: Professional

## 2020-01-07 DIAGNOSIS — F411 Generalized anxiety disorder: Secondary | ICD-10-CM | POA: Diagnosis not present

## 2020-01-07 LAB — COMPLETE METABOLIC PANEL WITH GFR
AG Ratio: 2.2 (calc) (ref 1.0–2.5)
ALT: 14 U/L (ref 6–29)
AST: 16 U/L (ref 10–30)
Albumin: 4.4 g/dL (ref 3.6–5.1)
Alkaline phosphatase (APISO): 29 U/L — ABNORMAL LOW (ref 31–125)
BUN: 12 mg/dL (ref 7–25)
CO2: 27 mmol/L (ref 20–32)
Calcium: 9.2 mg/dL (ref 8.6–10.2)
Chloride: 104 mmol/L (ref 98–110)
Creat: 0.76 mg/dL (ref 0.50–1.10)
GFR, Est African American: 125 mL/min/{1.73_m2} (ref >=60)
GFR, Est Non African American: 108 mL/min/{1.73_m2} (ref >=60)
Globulin: 2 g/dL (calc) (ref 1.9–3.7)
Glucose, Bld: 85 mg/dL (ref 65–139)
Potassium: 4.2 mmol/L (ref 3.5–5.3)
Sodium: 138 mmol/L (ref 135–146)
Total Bilirubin: 0.3 mg/dL (ref 0.2–1.2)
Total Protein: 6.4 g/dL (ref 6.1–8.1)

## 2020-01-07 LAB — CBC
HCT: 37.7 % (ref 35.0–45.0)
Hemoglobin: 12.2 g/dL (ref 11.7–15.5)
MCH: 28.7 pg (ref 27.0–33.0)
MCHC: 32.4 g/dL (ref 32.0–36.0)
MCV: 88.7 fL (ref 80.0–100.0)
MPV: 10.8 fL (ref 7.5–12.5)
Platelets: 214 10*3/uL (ref 140–400)
RBC: 4.25 10*6/uL (ref 3.80–5.10)
RDW: 13.6 % (ref 11.0–15.0)
WBC: 4.4 10*3/uL (ref 3.8–10.8)

## 2020-01-07 LAB — VITAMIN B12: Vitamin B-12: 445 pg/mL (ref 200–1100)

## 2020-01-07 LAB — HEMOGLOBIN A1C
Hgb A1c MFr Bld: 4.8 % of total Hgb (ref <5.7)
Mean Plasma Glucose: 91 (calc)
eAG (mmol/L): 5 (calc)

## 2020-01-12 ENCOUNTER — Ambulatory Visit (INDEPENDENT_AMBULATORY_CARE_PROVIDER_SITE_OTHER): Payer: No Typology Code available for payment source | Admitting: Professional

## 2020-01-12 DIAGNOSIS — F411 Generalized anxiety disorder: Secondary | ICD-10-CM

## 2020-01-13 ENCOUNTER — Ambulatory Visit: Payer: No Typology Code available for payment source | Admitting: Professional

## 2020-01-19 ENCOUNTER — Ambulatory Visit (INDEPENDENT_AMBULATORY_CARE_PROVIDER_SITE_OTHER): Payer: No Typology Code available for payment source | Admitting: Professional

## 2020-01-19 DIAGNOSIS — F411 Generalized anxiety disorder: Secondary | ICD-10-CM

## 2020-02-03 ENCOUNTER — Ambulatory Visit (INDEPENDENT_AMBULATORY_CARE_PROVIDER_SITE_OTHER): Payer: No Typology Code available for payment source | Admitting: Professional

## 2020-02-03 DIAGNOSIS — F411 Generalized anxiety disorder: Secondary | ICD-10-CM | POA: Diagnosis not present

## 2020-02-16 ENCOUNTER — Ambulatory Visit (INDEPENDENT_AMBULATORY_CARE_PROVIDER_SITE_OTHER): Payer: No Typology Code available for payment source | Admitting: Professional

## 2020-02-16 DIAGNOSIS — F411 Generalized anxiety disorder: Secondary | ICD-10-CM

## 2020-03-02 ENCOUNTER — Ambulatory Visit (INDEPENDENT_AMBULATORY_CARE_PROVIDER_SITE_OTHER): Payer: No Typology Code available for payment source | Admitting: Professional

## 2020-03-02 DIAGNOSIS — F411 Generalized anxiety disorder: Secondary | ICD-10-CM | POA: Diagnosis not present

## 2020-03-08 ENCOUNTER — Ambulatory Visit (INDEPENDENT_AMBULATORY_CARE_PROVIDER_SITE_OTHER): Payer: No Typology Code available for payment source | Admitting: Professional

## 2020-03-08 DIAGNOSIS — F411 Generalized anxiety disorder: Secondary | ICD-10-CM | POA: Diagnosis not present

## 2020-03-16 ENCOUNTER — Ambulatory Visit (INDEPENDENT_AMBULATORY_CARE_PROVIDER_SITE_OTHER): Payer: No Typology Code available for payment source | Admitting: Professional

## 2020-03-16 DIAGNOSIS — F411 Generalized anxiety disorder: Secondary | ICD-10-CM | POA: Diagnosis not present

## 2020-03-27 ENCOUNTER — Encounter: Payer: Self-pay | Admitting: Osteopathic Medicine

## 2020-03-27 ENCOUNTER — Other Ambulatory Visit: Payer: Self-pay | Admitting: Osteopathic Medicine

## 2020-03-27 MED ORDER — ETONOGESTREL-ETHINYL ESTRADIOL 0.12-0.015 MG/24HR VA RING: VAGINAL_RING | VAGINAL | 3 refills | Status: DC

## 2020-03-30 ENCOUNTER — Ambulatory Visit (INDEPENDENT_AMBULATORY_CARE_PROVIDER_SITE_OTHER): Payer: No Typology Code available for payment source | Admitting: Professional

## 2020-03-30 DIAGNOSIS — F411 Generalized anxiety disorder: Secondary | ICD-10-CM | POA: Diagnosis not present

## 2020-04-05 ENCOUNTER — Ambulatory Visit (INDEPENDENT_AMBULATORY_CARE_PROVIDER_SITE_OTHER): Payer: No Typology Code available for payment source | Admitting: Professional

## 2020-04-05 DIAGNOSIS — F411 Generalized anxiety disorder: Secondary | ICD-10-CM | POA: Diagnosis not present

## 2020-04-27 ENCOUNTER — Ambulatory Visit (INDEPENDENT_AMBULATORY_CARE_PROVIDER_SITE_OTHER): Payer: No Typology Code available for payment source | Admitting: Professional

## 2020-04-27 DIAGNOSIS — F411 Generalized anxiety disorder: Secondary | ICD-10-CM

## 2020-05-11 ENCOUNTER — Ambulatory Visit (INDEPENDENT_AMBULATORY_CARE_PROVIDER_SITE_OTHER): Payer: No Typology Code available for payment source | Admitting: Professional

## 2020-05-11 DIAGNOSIS — F411 Generalized anxiety disorder: Secondary | ICD-10-CM

## 2020-05-17 ENCOUNTER — Encounter: Payer: No Typology Code available for payment source | Admitting: Osteopathic Medicine

## 2020-05-25 ENCOUNTER — Ambulatory Visit (INDEPENDENT_AMBULATORY_CARE_PROVIDER_SITE_OTHER): Payer: No Typology Code available for payment source | Admitting: Professional

## 2020-05-25 DIAGNOSIS — F411 Generalized anxiety disorder: Secondary | ICD-10-CM | POA: Diagnosis not present

## 2020-06-01 ENCOUNTER — Encounter: Payer: No Typology Code available for payment source | Admitting: Osteopathic Medicine

## 2020-06-01 ENCOUNTER — Other Ambulatory Visit: Payer: Self-pay | Admitting: Medical-Surgical

## 2020-06-01 ENCOUNTER — Encounter: Payer: Self-pay | Admitting: Medical-Surgical

## 2020-06-01 ENCOUNTER — Ambulatory Visit (INDEPENDENT_AMBULATORY_CARE_PROVIDER_SITE_OTHER): Payer: No Typology Code available for payment source | Admitting: Medical-Surgical

## 2020-06-01 VITALS — BP 130/82 | HR 69 | Ht 64.0 in | Wt 135.0 lb

## 2020-06-01 DIAGNOSIS — Z0000 Encounter for general adult medical examination without abnormal findings: Secondary | ICD-10-CM | POA: Diagnosis not present

## 2020-06-01 DIAGNOSIS — Z1159 Encounter for screening for other viral diseases: Secondary | ICD-10-CM | POA: Diagnosis not present

## 2020-06-01 DIAGNOSIS — F419 Anxiety disorder, unspecified: Secondary | ICD-10-CM | POA: Diagnosis not present

## 2020-06-01 DIAGNOSIS — Z114 Encounter for screening for human immunodeficiency virus [HIV]: Secondary | ICD-10-CM | POA: Diagnosis not present

## 2020-06-01 MED ORDER — ETONOGESTREL-ETHINYL ESTRADIOL 0.12-0.015 MG/24HR VA RING
VAGINAL_RING | VAGINAL | 3 refills | Status: DC
  Filled 2020-12-04: qty 3, 84d supply, fill #0

## 2020-06-01 MED ORDER — ESCITALOPRAM OXALATE 10 MG PO TABS: ORAL_TABLET | ORAL | 0 refills | Status: DC

## 2020-06-01 NOTE — Progress Notes (Signed)
HPI: Tracy Schneider is a 29 y.o. female who  has no past medical history on file.  she presents to [facility] today, 06/01/20,  for chief complaint of:  Annual physical exam  Dentist: every 6 months, no concers Eye exam: Yearly, contacts and glasses at times Exercise: at least 3 days per week at the gym Diet: well balanced, no special diets  Concerns: Anxiety- started therapy at the beginning of the year and doing well on that. Still feeling as if she is living in a high state of anxiety at all times.  This appears to be generalized and encompasses both work and home life.  Feels that she is at a point she would like to start on a medication.  Denies SI/HI.  Past medical, surgical, social and family history reviewed:  There are no problems to display for this patient.   Past Surgical History:  Procedure Laterality Date  . RHINOPLASTY  10/2005   septorhinoplasty    Social History   Tobacco Use  . Smoking status: Never Smoker  . Smokeless tobacco: Never Used  Substance Use Topics  . Alcohol use: Not Currently    Family History  Problem Relation Age of Onset  . High blood pressure Maternal Grandmother   . Stroke Paternal Grandmother   . Colon cancer Paternal Grandfather      Current medication list and allergy/intolerance information reviewed:    Current Outpatient Medications  Medication Sig Dispense Refill  . etonogestrel-ethinyl estradiol (NUVARING) 0.12-0.015 MG/24HR vaginal ring Place 1 ring vaginally, leave in place 3 weeks, remove for 1 week, place new ring after that 3 each 3  . Multiple Vitamins-Minerals (MULTIVITAMIN WITH MINERALS) tablet Take 1 tablet by mouth daily.    Marland Kitchen escitalopram (LEXAPRO) 10 MG tablet Take 1/2 tablet (5mg ) daily x 8 days then take 1 tablet (10mg ) daily. 90 tablet 0   No current facility-administered medications for this visit.    No Known Allergies    Review of Systems:  Constitutional:  No   fever, no chills, No recent illness, No unintentional weight changes. No significant fatigue.   HEENT: No  headache, no vision change, no hearing change, No sore throat, No  sinus pressure  Cardiac: No  chest pain, No  pressure, No palpitations, No  Orthopnea  Respiratory:  No  shortness of breath. No  Cough  Gastrointestinal: No  abdominal pain, No  nausea, No  vomiting,  No  blood in stool, No  diarrhea, No  constipation   Musculoskeletal: No new myalgia/arthralgia  Skin: No  Rash, No other wounds/concerning lesions  Genitourinary: No  incontinence, No  abnormal genital bleeding, No abnormal genital discharge  Hem/Onc: No  easy bruising/bleeding, No  abnormal lymph node  Endocrine: No cold intolerance,  No heat intolerance. No polyuria/polydipsia/polyphagia   Neurologic: No  weakness, No  dizziness, No  slurred speech/focal weakness/facial droop  Psychiatric: No  concerns with depression, +  concerns with anxiety, No sleep problems, No mood problems  Exam:  BP 130/82   Pulse 69   Ht 5\' 4"  (1.626 m)   Wt 135 lb (61.2 kg)   SpO2 100%   BMI 23.17 kg/m   Constitutional: VS see above. General Appearance: alert, well-developed, well-nourished, NAD  Eyes: Normal lids and conjunctive, non-icteric sclera  Ears, Nose, Mouth, Throat: MMM, Normal external inspection ears. TM normal bilaterally.  Oral and nasal assessment deferred due to Covid precautions.   Neck: No masses, trachea midline. No thyroid  enlargement. No tenderness/mass appreciated. No lymphadenopathy  Respiratory: Normal respiratory effort. no wheeze, no rhonchi, no rales  Cardiovascular: S1/S2 normal, no murmur, no rub/gallop auscultated. RRR. No lower extremity edema. No carotid bruit or JVD. No abdominal aortic bruit.  Gastrointestinal: Nontender, no masses. No hepatomegaly, no splenomegaly. No hernia appreciated. Bowel sounds normal. Rectal exam deferred.   Musculoskeletal: Gait normal. No clubbing/cyanosis of  digits.   Neurological: Normal balance/coordination. No tremor. No cranial nerve deficit on limited exam. Motor and sensation intact and symmetric. Cerebellar reflexes intact.   Skin: warm, dry, intact. No rash/ulcer. No concerning nevi or subq nodules on limited exam.    Psychiatric: Normal judgment/insight. Normal mood and affect. Oriented x3.    No results found for this or any previous visit (from the past 72 hour(s)).  No results found.   ASSESSMENT/PLAN:   1. Annual physical exam Recent CBC and CMP on file.  Checking lipid panel today. - Lipid panel  2. Anxiety Starting Lexapro at 5 mg daily x8 days then increasing to 10 mg daily thereafter.  Continue therapy as directed.  3. Need for hepatitis C screening test Discussed screening recommendations.  Patient is agreeable so we will add this to blood work today. - Hepatitis C Antibody  4. Encounter for screening for HIV Discussed screening recommendations.  Patient is agreeable so we will also add this to blood work today. - HIV antibody (with reflex)  Orders Placed This Encounter  Procedures  . Lipid panel  . HIV antibody (with reflex)  . Hepatitis C Antibody    Meds ordered this encounter  Medications  . etonogestrel-ethinyl estradiol (NUVARING) 0.12-0.015 MG/24HR vaginal ring    Sig: Place 1 ring vaginally, leave in place 3 weeks, remove for 1 week, place new ring after that    Dispense:  3 each    Refill:  3  . escitalopram (LEXAPRO) 10 MG tablet    Sig: Take 1/2 tablet (5mg ) daily x 8 days then take 1 tablet (10mg ) daily.    Dispense:  90 tablet    Refill:  0    Order Specific Question:   Supervising Provider    Answer:       There are no Patient Instructions on file for this visit.  Follow-up plan: Return in about 4 weeks (around 06/29/2020) for mood follow up.  [[id number]], DNP, APRN, FNP-BC [facility]

## 2020-06-04 LAB — LIPID PANEL
Cholesterol: 212 mg/dL — ABNORMAL HIGH
HDL: 57 mg/dL
LDL Cholesterol (Calc): 130 mg/dL — ABNORMAL HIGH
Non-HDL Cholesterol (Calc): 155 mg/dL — ABNORMAL HIGH
Total CHOL/HDL Ratio: 3.7 (calc)
Triglycerides: 137 mg/dL

## 2020-06-04 LAB — HEPATITIS C ANTIBODY
Hepatitis C Ab: NONREACTIVE
SIGNAL TO CUT-OFF: 0 (ref <1.00)

## 2020-06-04 LAB — HIV ANTIBODY (ROUTINE TESTING W REFLEX): HIV 1&2 Ab, 4th Generation: NONREACTIVE

## 2020-06-08 ENCOUNTER — Ambulatory Visit (INDEPENDENT_AMBULATORY_CARE_PROVIDER_SITE_OTHER): Payer: No Typology Code available for payment source | Admitting: Professional

## 2020-06-08 DIAGNOSIS — F411 Generalized anxiety disorder: Secondary | ICD-10-CM

## 2020-06-22 ENCOUNTER — Ambulatory Visit (INDEPENDENT_AMBULATORY_CARE_PROVIDER_SITE_OTHER): Payer: No Typology Code available for payment source | Admitting: Professional

## 2020-06-22 DIAGNOSIS — F411 Generalized anxiety disorder: Secondary | ICD-10-CM

## 2020-07-06 ENCOUNTER — Ambulatory Visit (INDEPENDENT_AMBULATORY_CARE_PROVIDER_SITE_OTHER): Payer: No Typology Code available for payment source | Admitting: Professional

## 2020-07-06 DIAGNOSIS — F411 Generalized anxiety disorder: Secondary | ICD-10-CM | POA: Diagnosis not present

## 2020-07-20 ENCOUNTER — Ambulatory Visit: Payer: No Typology Code available for payment source | Admitting: Professional

## 2020-07-20 ENCOUNTER — Encounter: Payer: Self-pay | Admitting: Osteopathic Medicine

## 2020-07-20 ENCOUNTER — Other Ambulatory Visit: Payer: Self-pay | Admitting: Osteopathic Medicine

## 2020-07-20 ENCOUNTER — Telehealth (INDEPENDENT_AMBULATORY_CARE_PROVIDER_SITE_OTHER): Payer: No Typology Code available for payment source | Admitting: Osteopathic Medicine

## 2020-07-20 VITALS — Wt 140.0 lb

## 2020-07-20 DIAGNOSIS — F411 Generalized anxiety disorder: Secondary | ICD-10-CM | POA: Diagnosis not present

## 2020-07-20 MED ORDER — ESCITALOPRAM OXALATE 20 MG PO TABS: 20.0000 mg | ORAL_TABLET | Freq: Every day | ORAL | 0 refills | Status: DC

## 2020-07-20 NOTE — Progress Notes (Signed)
Virtual Visit via Video (App used: MyChart) Note  I connected with      Tracy Schneider on 07/20/20 at 10:03 AM  by a telemedicine application and verified that I am speaking with the correct person using two identifiers.  Patient is at work I am in office   I discussed the limitations of evaluation and management by telemedicine and the availability of in person appointments. The patient expressed understanding and agreed to proceed.  History of Present Illness: Tracy Schneider is a 29 y.o. female who would like to discuss mental health    Antianxiety Rx started by Christen Butter FNP on 06/01/2020: Lexapro - feeling a little better, certainly not worse. Taking 10 mg consistently for 4-6 weeks.     Observations/Objective: Wt 140 lb (63.5 kg)   LMP 06/29/2020   BMI 24.03 kg/m  BP Readings from Last 3 Encounters:  06/01/20 130/82  01/06/20 121/75  08/22/19 124/67   Exam: Normal Speech.  NAD  Lab and Radiology Results No results found for this or any previous visit (from the past 72 hour(s)). No results found.     Assessment and Plan: 29 y.o. female with The encounter diagnosis was Generalized anxiety disorder.  --> improvement is present, but minimal, No adverse effects. I think ok to trial increase Lexapro to 15 mg for 5-7 days then up to 20 mg. MyChart set to message in 2 weeks to check in, virtual/in-office in 4 weeks    PDMP not reviewed this encounter. No orders of the defined types were placed in this encounter.  Meds ordered this encounter  Medications  . escitalopram (LEXAPRO) 20 MG tablet    Sig: Take 1 tablet (20 mg total) by mouth at bedtime.    Dispense:  90 tablet    Refill:  0      Follow Up Instructions: Return in about 4 weeks (around 08/17/2020) for VIRTUAL *OR* IN-OFFICE VISIT, RECHECK ON HIGER DOSE ANXIETY MEDICATION.    I discussed the assessment and treatment plan with the patient. The patient was provided an opportunity to ask questions and  all were answered. The patient agreed with the plan and demonstrated an understanding of the instructions.   The patient was advised to call back or seek an in-person evaluation if any new concerns, if symptoms worsen or if the condition fails to improve as anticipated.  15 minutes of non-face-to-face time was provided during this encounter.      . . . . . . . . . . . . . Marland Kitchen                   Historical information moved to improve visibility of documentation.  No past medical history on file. Past Surgical History:  Procedure Laterality Date  . RHINOPLASTY  10/2005   septorhinoplasty   Social History   Tobacco Use  . Smoking status: Never Smoker  . Smokeless tobacco: Never Used  Substance Use Topics  . Alcohol use: Not Currently   family history includes Colon cancer in her paternal grandfather; High blood pressure in her maternal grandmother; Stroke in her paternal grandmother.  Medications: Current Outpatient Medications  Medication Sig Dispense Refill  . escitalopram (LEXAPRO) 20 MG tablet Take 1 tablet (20 mg total) by mouth at bedtime. 90 tablet 0  . etonogestrel-ethinyl estradiol (NUVARING) 0.12-0.015 MG/24HR vaginal ring Place 1 ring vaginally, leave in place 3 weeks, remove for 1 week, place new ring after that 3 each 3  .  Multiple Vitamins-Minerals (MULTIVITAMIN WITH MINERALS) tablet Take 1 tablet by mouth daily.     No current facility-administered medications for this visit.   No Known Allergies                                                                   Depression screen [facility] 2/9 07/20/2020 06/01/2020 07/11/2019  Decreased Interest 1 1 0  Down, Depressed, Hopeless 0 1 1  PHQ - 2 Score 1 2 1   Altered sleeping 1 1 1   Tired, decreased energy 1 1 0  Change in appetite 0 0 0  Feeling bad or failure about yourself  0 0 0  Trouble concentrating 0 0 0  Moving slowly or fidgety/restless 0 0 0  Suicidal thoughts 0 0 0  PHQ-9  Score 3 4 2   Difficult doing work/chores Somewhat difficult Somewhat difficult Not difficult at all   GAD 7 : Generalized Anxiety Score 07/20/2020 06/01/2020 07/11/2019  Nervous, Anxious, on Edge 2 2 1   Control/stop worrying 2 3 1   Worry too much - different things 3 2 2   Trouble relaxing 2 1 1   Restless 1 0 0  Easily annoyed or irritable 2 1 2   Afraid - awful might happen 3 3 1   Total GAD 7 Score 15 12 8   Anxiety Difficulty Somewhat difficult Somewhat difficult Somewhat difficult

## 2020-07-27 ENCOUNTER — Ambulatory Visit (INDEPENDENT_AMBULATORY_CARE_PROVIDER_SITE_OTHER): Payer: No Typology Code available for payment source | Admitting: Professional

## 2020-07-27 DIAGNOSIS — F411 Generalized anxiety disorder: Secondary | ICD-10-CM | POA: Diagnosis not present

## 2020-08-03 ENCOUNTER — Ambulatory Visit: Payer: No Typology Code available for payment source | Admitting: Professional

## 2020-08-10 ENCOUNTER — Ambulatory Visit: Payer: No Typology Code available for payment source | Admitting: Professional

## 2020-08-17 ENCOUNTER — Ambulatory Visit (INDEPENDENT_AMBULATORY_CARE_PROVIDER_SITE_OTHER): Payer: No Typology Code available for payment source | Admitting: Professional

## 2020-08-17 ENCOUNTER — Ambulatory Visit: Payer: No Typology Code available for payment source | Admitting: Professional

## 2020-08-17 DIAGNOSIS — F411 Generalized anxiety disorder: Secondary | ICD-10-CM | POA: Diagnosis not present

## 2020-08-31 ENCOUNTER — Ambulatory Visit (INDEPENDENT_AMBULATORY_CARE_PROVIDER_SITE_OTHER): Payer: No Typology Code available for payment source | Admitting: Professional

## 2020-08-31 ENCOUNTER — Ambulatory Visit: Payer: No Typology Code available for payment source | Admitting: Professional

## 2020-08-31 DIAGNOSIS — F411 Generalized anxiety disorder: Secondary | ICD-10-CM

## 2020-09-14 ENCOUNTER — Ambulatory Visit: Payer: No Typology Code available for payment source | Admitting: Professional

## 2020-09-21 ENCOUNTER — Ambulatory Visit (INDEPENDENT_AMBULATORY_CARE_PROVIDER_SITE_OTHER): Payer: No Typology Code available for payment source | Admitting: Professional

## 2020-09-21 DIAGNOSIS — F411 Generalized anxiety disorder: Secondary | ICD-10-CM

## 2020-09-28 ENCOUNTER — Ambulatory Visit: Payer: No Typology Code available for payment source | Admitting: Professional

## 2020-10-11 ENCOUNTER — Other Ambulatory Visit: Payer: Self-pay | Admitting: Osteopathic Medicine

## 2020-10-11 ENCOUNTER — Emergency Department
Admission: RE | Admit: 2020-10-11 | Discharge: 2020-10-11 | Disposition: A | Discharge status: Home / Self Care | Payer: No Typology Code available for payment source | Source: Ambulatory Visit | Attending: Family Medicine | Admitting: Family Medicine

## 2020-10-11 ENCOUNTER — Other Ambulatory Visit: Payer: Self-pay

## 2020-10-11 ENCOUNTER — Ambulatory Visit: Payer: No Typology Code available for payment source | Admitting: Professional

## 2020-10-11 VITALS — BP 118/78 | HR 97 | Temp 97.4°F | Resp 18 | Ht 64.0 in | Wt 140.0 lb

## 2020-10-11 DIAGNOSIS — A084 Viral intestinal infection, unspecified: Secondary | ICD-10-CM

## 2020-10-11 DIAGNOSIS — R1010 Upper abdominal pain, unspecified: Secondary | ICD-10-CM

## 2020-10-11 LAB — POCT URINALYSIS DIP (MANUAL ENTRY)
Blood, UA: NEGATIVE
Glucose, UA: NEGATIVE mg/dL
Leukocytes, UA: NEGATIVE
Nitrite, UA: NEGATIVE
Protein Ur, POC: 100 mg/dL — AB
Spec Grav, UA: >=1.03 — AB (ref 1.010–1.025)
Urobilinogen, UA: 1 E.U./dL
pH, UA: 6 (ref 5.0–8.0)

## 2020-10-11 MED ORDER — ONDANSETRON 4 MG PO TBDP: ORAL_TABLET | ORAL | 0 refills | Status: DC

## 2020-10-11 MED ORDER — ESCITALOPRAM OXALATE 20 MG PO TABS: 20.0000 mg | ORAL_TABLET | Freq: Every day | ORAL | 0 refills | Status: DC

## 2020-10-11 NOTE — ED Provider Notes (Signed)
[facility]    CSN: [id number] Arrival date & time: 10/11/20  1057      History   Chief Complaint No chief complaint on file.   HPI Tracy Schneider is a 30 y.o. female.   Yesterday patient developed abdominal cramps/bloating, chills, myalgias, loose stools, temperature 100, nausea (without vomiting), and headache.  She had continued loose stools this morning.  She denies recent foreign travel, or drinking untreated water in a wilderness environment.  She denies recent antibiotic use.  A rapid COVID19 test was negative.  She has had COVID19 vaccinations.  The history is provided by the patient.    History reviewed. No pertinent past medical history.  There are no problems to display for this patient.   Past Surgical History:  Procedure Laterality Date  . RHINOPLASTY  10/2005   septorhinoplasty    OB History   No obstetric history on file.      Home Medications    Prior to Admission medications   Medication Sig Start Date End Date Taking? Authorizing Provider  ondansetron (ZOFRAN ODT) 4 MG disintegrating tablet Take one tab by mouth Q6hr prn nausea 10/11/20  Yes Lattie Haw, MD  escitalopram (LEXAPRO) 20 MG tablet Take 1 tablet (20 mg total) by mouth at bedtime. 10/11/20   Sunnie Nielsen, DO  etonogestrel-ethinyl estradiol (NUVARING) 0.12-0.015 MG/24HR vaginal ring Place 1 ring vaginally, leave in place 3 weeks, remove for 1 week, place new ring after that 06/01/20   Christen Butter, NP  Multiple Vitamins-Minerals (MULTIVITAMIN WITH MINERALS) tablet Take 1 tablet by mouth daily.    [provider]    Family History Family History  Problem Relation Age of Onset  . High blood pressure Maternal Grandmother   . Stroke Paternal Grandmother   . Colon cancer Paternal Grandfather   . Healthy Mother   . Healthy Father     Social History Social History   Tobacco Use  . Smoking status: Never Smoker  . Smokeless tobacco: Never Used  Vaping Use   . Vaping Use: Never used  Substance Use Topics  . Alcohol use: Not Currently  . Drug use: Never     Allergies   Patient has no known allergies.   Review of Systems Review of Systems  Constitutional: Positive for activity change, appetite change, chills, diaphoresis, fatigue and fever. Negative for unexpected weight change.  HENT: Negative.   Respiratory: Negative.   Cardiovascular: Negative.   Gastrointestinal: Positive for abdominal pain, diarrhea and nausea. Negative for abdominal distention, blood in stool, constipation and vomiting.  Genitourinary: Negative.   Musculoskeletal: Positive for myalgias.  Skin: Negative.   Neurological: Positive for headaches.     Physical Exam Triage Vital Signs ED Triage Vitals  Enc Vitals Group     BP 10/11/20 1119 118/78     Pulse Rate 10/11/20 1119 97     Resp 10/11/20 1119 18     Temp 10/11/20 1119 (!) 97.4 F (36.3 C)     Temp Source 10/11/20 1119 Oral     SpO2 10/11/20 1119 100 %     Weight 10/11/20 1121 140 lb (63.5 kg)     Height 10/11/20 1121 5\' 4"  (1.626 m)     Head Circumference --      Peak Flow --      Pain Score 10/11/20 1120 6     Pain Loc --      Pain Edu? --      Excl. in GC? --  No data found.  Updated Vital Signs BP 118/78 (BP Location: Right Arm)   Pulse 97   Temp (!) 97.4 F (36.3 C) (Oral)   Resp 18   Ht 5\' 4"  (1.626 m)   Wt 63.5 kg   SpO2 100%   BMI 24.03 kg/m   Visual Acuity Right Eye Distance:   Left Eye Distance:   Bilateral Distance:    Right Eye Near:   Left Eye Near:    Bilateral Near:     Physical Exam Nursing notes and Vital Signs reviewed. Appearance:  Patient appears stated age, and in no acute distress.    Eyes:  Pupils are equal, round, and reactive to light and accomodation.  Extraocular movement is intact.  Conjunctivae are not inflamed   Pharynx:  Normal; moist mucous membranes  Neck:  Supple.  No adenopathy Lungs:  Clear to auscultation.  Breath sounds are equal.   Moving air well. Heart:  Regular rate and rhythm without murmurs, rubs, or gallops.  Abdomen:  Nontender without masses or hepatosplenomegaly.  Bowel sounds are present.  No CVA or flank tenderness.  Extremities:  No edema.  Skin:  No rash present.     UC Treatments / Results  Labs (all labs ordered are listed, but only abnormal results are displayed) Labs Reviewed  POCT URINALYSIS DIP (MANUAL ENTRY) - Abnormal; Notable for the following components:      Result Value   Color, UA brown (*)    Bilirubin, UA small (*)    Ketones, POC UA trace (5) (*)    Spec Grav, UA >=1.030 (*)    Protein Ur, POC =100 (*)    All other components within normal limits  NOVEL CORONAVIRUS, NAA    EKG   Radiology No results found.  Procedures Procedures (including critical care time)  Medications Ordered in UC Medications - No data to display  Initial Impression / Assessment and Plan / UC Course  I have reviewed the triage vital signs and the nursing notes.  Pertinent labs & imaging results that were available during my care of the patient were reviewed by me and considered in my medical decision making (see chart for details).    Benign exam. Suspect viral gastroenteritis. Administered Zofran ODT 4mg  PO; given Rx for same. COVID19 PCR pending. Followup with Family Doctor if not improved in about 4 days.   Final Clinical Impressions(s) / UC Diagnoses   Final diagnoses:  Pain of upper abdomen  Viral gastroenteritis     Discharge Instructions     Begin Pedialyte for about 12 to 18 hours until diarrhea stops, then switch to clear liquids (apple juice, clear grape juice, Jello, etc) for about 12 to 18 hours.  When improved, advance to a (Bananas, Rice, Applesauce, Toast). Then gradually resume a regular diet when tolerated.  Avoid milk products until well.    If symptoms become significantly worse during the night or over the weekend, proceed to the local emergency  room.     ED Prescriptions    Medication Sig Dispense Auth. Provider   ondansetron (ZOFRAN ODT) 4 MG disintegrating tablet Take one tab by mouth Q6hr prn nausea 12 tablet , MD        [employer], MD 10/12/20 [phone]

## 2020-10-11 NOTE — Discharge Instructions (Addendum)
Begin Pedialyte for about 12 to 18 hours until diarrhea stops, then switch to clear liquids (apple juice, clear grape juice, Jello, etc) for about 12 to 18 hours.  When improved, advance to a BRAT diet (Bananas, Rice, Applesauce, Toast). Then gradually resume a regular diet when tolerated.  Avoid milk products until well.     If symptoms become significantly worse during the night or over the weekend, proceed to the local emergency room.  

## 2020-10-11 NOTE — ED Triage Notes (Signed)
Abdominal pain started yesterday, loose stools, nausea, temp 100, Covid test negative, chills, sweaty, continued loose stools this morning.  Vaccinated.

## 2020-10-12 LAB — NOVEL CORONAVIRUS, NAA: SARS-CoV-2, NAA: NOT DETECTED

## 2020-10-12 LAB — SARS-COV-2, NAA 2 DAY TAT

## 2020-11-02 ENCOUNTER — Ambulatory Visit (INDEPENDENT_AMBULATORY_CARE_PROVIDER_SITE_OTHER): Payer: No Typology Code available for payment source | Admitting: Professional

## 2020-11-02 DIAGNOSIS — F411 Generalized anxiety disorder: Secondary | ICD-10-CM | POA: Diagnosis not present

## 2020-11-23 ENCOUNTER — Ambulatory Visit: Payer: No Typology Code available for payment source | Admitting: Professional

## 2020-12-03 ENCOUNTER — Other Ambulatory Visit (HOSPITAL_COMMUNITY): Payer: Self-pay

## 2020-12-04 ENCOUNTER — Other Ambulatory Visit (HOSPITAL_COMMUNITY): Payer: Self-pay

## 2020-12-07 ENCOUNTER — Ambulatory Visit (INDEPENDENT_AMBULATORY_CARE_PROVIDER_SITE_OTHER): Payer: No Typology Code available for payment source | Admitting: Professional

## 2020-12-07 DIAGNOSIS — F411 Generalized anxiety disorder: Secondary | ICD-10-CM

## 2021-01-04 ENCOUNTER — Ambulatory Visit (INDEPENDENT_AMBULATORY_CARE_PROVIDER_SITE_OTHER): Payer: No Typology Code available for payment source | Admitting: Professional

## 2021-01-04 DIAGNOSIS — F411 Generalized anxiety disorder: Secondary | ICD-10-CM | POA: Diagnosis not present

## 2021-01-11 ENCOUNTER — Other Ambulatory Visit (HOSPITAL_COMMUNITY): Payer: Self-pay

## 2021-01-11 ENCOUNTER — Encounter: Payer: Self-pay | Admitting: Osteopathic Medicine

## 2021-01-11 ENCOUNTER — Telehealth (INDEPENDENT_AMBULATORY_CARE_PROVIDER_SITE_OTHER): Payer: No Typology Code available for payment source | Admitting: Osteopathic Medicine

## 2021-01-11 DIAGNOSIS — F411 Generalized anxiety disorder: Secondary | ICD-10-CM | POA: Diagnosis not present

## 2021-01-11 DIAGNOSIS — Z309 Encounter for contraceptive management, unspecified: Secondary | ICD-10-CM

## 2021-01-11 MED ORDER — ESCITALOPRAM OXALATE 20 MG PO TABS
20.0000 mg | ORAL_TABLET | Freq: Every day | ORAL | 3 refills | Status: DC
  Filled 2021-01-11: qty 90, 90d supply, fill #0
  Filled 2021-05-03: qty 90, 90d supply, fill #1
  Filled 2021-05-03: qty 90, 90d supply, fill #0
  Filled 2021-07-23: qty 90, 90d supply, fill #1
  Filled 2021-10-10: qty 90, 90d supply, fill #2

## 2021-01-11 MED ORDER — NORGESTIMATE-ETH ESTRADIOL 0.25-35 MG-MCG PO TABS
1.0000 | ORAL_TABLET | Freq: Every day | ORAL | 3 refills | Status: DC
  Filled 2021-01-11 – 2021-05-03 (×2): qty 84, 84d supply, fill #0
  Filled 2021-07-23: qty 84, 84d supply, fill #1
  Filled 2021-10-10: qty 84, 84d supply, fill #2

## 2021-01-11 NOTE — Progress Notes (Signed)
Telemedicine Visit via  Video & Audio (App used: MyChart)   I connected with Tracy Schneider on 01/11/21 at 8:30 AM  by phone or  telemedicine application as noted above  I verified that I am speaking with or regarding  the correct patient using two identifiers.  Participants: Myself, Dr Sunnie Nielsen DO Patient: Tracy Schneider Patient proxy if applicable: none Other, if applicable: none  Patient is at home I am in office at [facility]    I discussed the limitations of evaluation and management  by telemedicine and the availability of in person appointments.  The participant(s) above expressed understanding and  agreed to proceed with this appointment via telemedicine.       History of Present Illness: Tracy Schneider is a 30 y.o. female who would like to discuss mental health, birth contorl. See A/P below. Doing well.      Observations/Objective: There were no vitals taken for this visit. BP Readings from Last 3 Encounters:  10/11/20 118/78  06/01/20 130/82  01/06/20 121/75   Exam: Normal Speech.  NAD  Lab and Radiology Results No results found for this or any previous visit (from the past 72 hour(s)). No results found.     Assessment and Plan: 30 y.o. female with The primary encounter diagnosis was Generalized anxiety disorder. A diagnosis of Encounter for contraceptive management, unspecified type was also pertinent to this visit.  1. Generalized anxiety disorder Doing well on Lexapro, requests refill of current dose  Refilled x1 year  2. Encounter for contraceptive management, unspecified type Would like to come off NuvaRing and back onto pills, no particular cocnerns just would like to try pills again since she's taking daily Rx w/ lexapro.  Sent Sprintec x1 year If wants to get back on NR, just send message or call and I can send that in   PDMP not reviewed this encounter. No orders of the defined types were placed in this  encounter.  Meds ordered this encounter  Medications  . escitalopram (LEXAPRO) 20 MG tablet    Sig: Take 1 tablet (20 mg total) by mouth daily.    Dispense:  90 tablet    Refill:  3  . norgestimate-ethinyl estradiol (ORTHO-CYCLEN, 28,) 0.25-35 MG-MCG tablet    Sig: Take 1 tablet by mouth daily.    Dispense:  84 tablet    Refill:  3   There are no Patient Instructions on file for this visit.  Instructions sent via MyChart.   Follow Up Instructions: No follow-ups on file.    I discussed the assessment and treatment plan with the patient. The patient was provided an opportunity to ask questions and all were answered. The patient agreed with the plan and demonstrated an understanding of the instructions.   The patient was advised to call back or seek an in-person evaluation if any new concerns, if symptoms worsen or if the condition fails to improve as anticipated.  15 minutes of non-face-to-face time was provided during this encounter.      . . . . . . . . . . . . . Marland Kitchen                   Historical information moved to improve visibility of documentation.  No past medical history on file. Past Surgical History:  Procedure Laterality Date  . RHINOPLASTY  10/2005   septorhinoplasty   Social History   Tobacco Use  . Smoking status: Never Smoker  . Smokeless tobacco: Never Used  Substance Use Topics  . Alcohol use: Not Currently   family history includes Colon cancer in her paternal grandfather; Healthy in her father and mother; High blood pressure in her maternal grandmother; Stroke in her paternal grandmother.  Medications: Current Outpatient Medications  Medication Sig Dispense Refill  . Multiple Vitamins-Minerals (MULTIVITAMIN WITH MINERALS) tablet Take 1 tablet by mouth daily.    . norgestimate-ethinyl estradiol (ORTHO-CYCLEN, 28,) 0.25-35 MG-MCG tablet Take 1 tablet by mouth daily. 84 tablet 3  . ondansetron (ZOFRAN ODT) 4 MG  disintegrating tablet Take one tab by mouth Q6hr prn nausea 12 tablet 0  . escitalopram (LEXAPRO) 20 MG tablet Take 1 tablet (20 mg total) by mouth daily. 90 tablet 3   No current facility-administered medications for this visit.   No Known Allergies   If phone visit, billing and coding can please add appropriate modifier if needed

## 2021-01-18 ENCOUNTER — Other Ambulatory Visit (HOSPITAL_COMMUNITY): Payer: Self-pay

## 2021-02-01 ENCOUNTER — Ambulatory Visit (INDEPENDENT_AMBULATORY_CARE_PROVIDER_SITE_OTHER): Payer: No Typology Code available for payment source | Admitting: Professional

## 2021-02-01 DIAGNOSIS — F411 Generalized anxiety disorder: Secondary | ICD-10-CM | POA: Diagnosis not present

## 2021-03-08 ENCOUNTER — Telehealth: Payer: No Typology Code available for payment source | Admitting: Physician Assistant

## 2021-03-08 ENCOUNTER — Encounter: Payer: Self-pay | Admitting: Osteopathic Medicine

## 2021-03-08 ENCOUNTER — Ambulatory Visit (INDEPENDENT_AMBULATORY_CARE_PROVIDER_SITE_OTHER): Payer: No Typology Code available for payment source | Admitting: Professional

## 2021-03-08 DIAGNOSIS — B379 Candidiasis, unspecified: Secondary | ICD-10-CM

## 2021-03-08 DIAGNOSIS — F411 Generalized anxiety disorder: Secondary | ICD-10-CM

## 2021-03-08 DIAGNOSIS — T3695XA Adverse effect of unspecified systemic antibiotic, initial encounter: Secondary | ICD-10-CM

## 2021-03-08 DIAGNOSIS — R3989 Other symptoms and signs involving the genitourinary system: Secondary | ICD-10-CM

## 2021-03-08 MED ORDER — CEPHALEXIN 500 MG PO CAPS: 500.0000 mg | ORAL_CAPSULE | Freq: Two times a day (BID) | ORAL | 0 refills | Status: DC

## 2021-03-08 MED ORDER — FLUCONAZOLE 150 MG PO TABS: 150.0000 mg | ORAL_TABLET | Freq: Once | ORAL | 0 refills | Status: AC

## 2021-03-08 NOTE — Progress Notes (Signed)

## 2021-03-11 ENCOUNTER — Encounter: Payer: Self-pay | Admitting: Family Medicine

## 2021-03-11 ENCOUNTER — Other Ambulatory Visit: Payer: Self-pay

## 2021-03-11 ENCOUNTER — Ambulatory Visit (INDEPENDENT_AMBULATORY_CARE_PROVIDER_SITE_OTHER): Payer: No Typology Code available for payment source | Admitting: Family Medicine

## 2021-03-11 ENCOUNTER — Ambulatory Visit (INDEPENDENT_AMBULATORY_CARE_PROVIDER_SITE_OTHER): Payer: No Typology Code available for payment source

## 2021-03-11 ENCOUNTER — Other Ambulatory Visit (HOSPITAL_COMMUNITY): Payer: Self-pay

## 2021-03-11 DIAGNOSIS — M25511 Pain in right shoulder: Secondary | ICD-10-CM | POA: Diagnosis not present

## 2021-03-11 MED ORDER — MELOXICAM 15 MG PO TABS
15.0000 mg | ORAL_TABLET | Freq: Every day | ORAL | 0 refills | Status: DC
  Filled 2021-03-11: qty 30, 30d supply, fill #0

## 2021-03-11 NOTE — Progress Notes (Signed)
Acute Office Visit  Subjective:    Patient ID: Tracy Schneider, female    DOB: [birth year], 30 y.o.   MRN: [id number]  Chief Complaint  Patient presents with   Shoulder Pain    HPI Patient is in today for right shoulder pain.   Two weeks ago, patient was in an Ultimate Frisbee tournament and states the day afterwards, she noticed her right shoulder was extremely sore. She first thought it was just general soreness from playing so much, but pain has continued to worsen. She states that any cross body motions, putting her arms in shirt to get dressed, lying on right shoulder are painful. At rest pain is 0/10, but gets up to 6/10 with those particular movements. She denies any significant weakness or loss of function. No history of shoulder problems, no known injuries/falls. She has been using ibuprofen 600 mg TID for relief.     History reviewed. No pertinent past medical history.  Past Surgical History:  Procedure Laterality Date   RHINOPLASTY  10/2005   septorhinoplasty    Family History  Problem Relation Age of Onset   High blood pressure Maternal Grandmother    Stroke Paternal Grandmother    Colon cancer Paternal Grandfather    Healthy Mother    Healthy Father     Social History   Socioeconomic History   Marital status: Single    Spouse name: Not on file   Number of children: Not on file   Years of education: Not on file   Highest education level: Not on file  Occupational History    Employer: [employer]  Tobacco Use   Smoking status: Never   Smokeless tobacco: Never  Vaping Use   Vaping Use: Never used  Substance and Sexual Activity   Alcohol use: Not Currently   Drug use: Never   Sexual activity: Yes    Partners: Male    Birth control/protection: Inserts  Other Topics Concern   Not on file  Social History Narrative   Not on file   Social Determinants of Health   Financial Resource Strain: Not on file  Food Insecurity: Not on file  Transportation  Needs: Not on file  Physical Activity: Not on file  Stress: Not on file  Social Connections: Not on file  Intimate Partner Violence: Not on file    Outpatient Medications Prior to Visit  Medication Sig Dispense Refill   cephALEXin (KEFLEX) 500 MG capsule Take 1 capsule (500 mg total) by mouth 2 (two) times daily. 14 capsule 0   escitalopram (LEXAPRO) 20 MG tablet Take 1 tablet (20 mg total) by mouth daily. 90 tablet 3   Multiple Vitamins-Minerals (MULTIVITAMIN WITH MINERALS) tablet Take 1 tablet by mouth daily.     norgestimate-ethinyl estradiol (ORTHO-CYCLEN, 28,) 0.25-35 MG-MCG tablet Take 1 tablet by mouth daily. 84 tablet 3   ondansetron (ZOFRAN ODT) 4 MG disintegrating tablet Take one tab by mouth Q6hr prn nausea 12 tablet 0   No facility-administered medications prior to visit.    No Known Allergies  Review of Systems All review of systems negative except what is listed in the HPI     Objective:    Physical Exam Constitutional:      Appearance: Normal appearance. She is normal weight.  Musculoskeletal:        General: Tenderness present. No swelling or deformity.     Comments: Right shoulder: posterior tenderness to deep palpation, - neers, + hawkins, pain with adduction, external rotation, abduction  Skin:    General: Skin is warm and dry.     Capillary Refill: Capillary refill takes less than 2 seconds.     Findings: No bruising, erythema or rash.  Neurological:     General: No focal deficit present.     Mental Status: She is alert and oriented to person, place, and time. Mental status is at baseline.  Psychiatric:        Mood and Affect: Mood normal.        Behavior: Behavior normal.        Thought Content: Thought content normal.    There were no vitals taken for this visit. Wt Readings from Last 3 Encounters:  10/11/20 140 lb (63.5 kg)  07/20/20 140 lb (63.5 kg)  06/01/20 135 lb (61.2 kg)    Health Maintenance Due  Topic Date Due   Pneumococcal Vaccine  6-60 Years old (1 - PCV) Never done   PAP-Cervical Cytology Screening  Never done   PAP SMEAR-Modifier  Never done   COVID-19 Vaccine (3 - Pfizer risk series) 10/18/2019    There are no preventive care reminders to display for this patient.   No results found for: TSH Lab Results  Component Value Date   WBC 4.4 01/06/2020   HGB 12.2 01/06/2020   HCT 37.7 01/06/2020   MCV 88.7 01/06/2020   PLT 214 01/06/2020   Lab Results  Component Value Date   NA 138 01/06/2020   K 4.2 01/06/2020   CO2 27 01/06/2020   GLUCOSE 85 01/06/2020   BUN 12 01/06/2020   CREATININE 0.76 01/06/2020   BILITOT 0.3 01/06/2020   AST 16 01/06/2020   ALT 14 01/06/2020   PROT 6.4 01/06/2020   CALCIUM 9.2 01/06/2020   Lab Results  Component Value Date   CHOL 212 (H) 06/01/2020   Lab Results  Component Value Date   HDL 57 06/01/2020   Lab Results  Component Value Date   LDLCALC 130 (H) 06/01/2020   Lab Results  Component Value Date   TRIG 137 06/01/2020   Lab Results  Component Value Date   CHOLHDL 3.7 06/01/2020   Lab Results  Component Value Date   HGBA1C 4.8 01/06/2020       Assessment & Plan:   1. Acute pain of right shoulder Consider rotator cuff tendinopathy. Recommend 3-4 more weeks of conservative therapy. Handout with exercises/stretches provided. PT referral placed. Meloxicam for pain and inflammation. X-ray today. If no significant improvement, follow-up with Dr. Karie Schwalbe for further evaluation. Patient aware of signs/symptoms requiring further/urgent evaluation.  - DG Shoulder Right; Future - meloxicam (MOBIC) 15 MG tablet; Take 1 tablet (15 mg total) by mouth daily.  Dispense: 30 tablet; Refill: 0 - Ambulatory referral to Physical Therapy  Follow-up in about 4 weeks or sooner if symptoms worsen.   Lollie Marrow Reola Calkins, DNP, FNP-C

## 2021-03-13 NOTE — Progress Notes (Signed)
MyChart message sent: Tracy Schneider, your shoulder x-ray was normal. Let's keep with the plan we discussed and you can follow-up at our office with Dr. Karie Schwalbe (Sports Medicine) after a trial of conservative measures and PT. Please let us know if you need anything, or if your symptoms change!

## 2021-03-15 ENCOUNTER — Ambulatory Visit: Payer: No Typology Code available for payment source | Admitting: Professional

## 2021-03-15 ENCOUNTER — Encounter: Payer: Self-pay | Admitting: Physical Therapy

## 2021-03-15 ENCOUNTER — Ambulatory Visit: Payer: No Typology Code available for payment source | Admitting: Physical Therapy

## 2021-03-15 ENCOUNTER — Other Ambulatory Visit: Payer: Self-pay

## 2021-03-15 DIAGNOSIS — M6281 Muscle weakness (generalized): Secondary | ICD-10-CM

## 2021-03-15 DIAGNOSIS — M25511 Pain in right shoulder: Secondary | ICD-10-CM

## 2021-03-15 NOTE — Therapy (Addendum)
[place], [place], [address] Phone: [phone]   Fax:  [phone]  Physical Therapy Evaluation and Discharge  Patient Details  Name: Tracy Schneider MRN: [id number] Date of Birth: November 11, 1990 Referring Provider (PT): Caleen Jobs   Encounter Date: 03/15/2021   PT End of Session - 03/15/21 0927     Visit Number 1    Number of Visits 6    Date for PT Re-Evaluation 04/26/21    PT Start Time 0845    PT Stop Time 0925    PT Time Calculation (min) 40 min    Activity Tolerance Patient tolerated treatment well    Behavior During Therapy [facility] for tasks assessed/performed             History reviewed. No pertinent past medical history.  Past Surgical History:  Procedure Laterality Date   RHINOPLASTY  10/2005   septorhinoplasty    There were no vitals filed for this visit.    Subjective Assessment - 03/15/21 0848     Subjective Pt states she plays competitive ultimate frisbee and noticed Rt shoulder pain about 3 weeks ago. Pt states pain initially got worse and she self treated with RICE and NSAIDs but pain did not resolve. Pt went to MD who prescribed meloxicam and physical therapy. Pain worsens with horizontal adduction and shoulder extension and ER. Eases with ice and meds    Diagnostic tests x ray negative for fracture    Patient Stated Goals return to ultimate frisbee with less pain    Currently in Pain? No/denies                [facility] PT Assessment - 03/15/21 0001       Assessment   Medical Diagnosis acute pain of Right shoulder    Referring Provider (PT) Caleen Jobs      Balance Screen   Has the patient fallen in the past 6 months No      Prior Function   Level of Independence Independent      Observation/Other Assessments   Focus on Therapeutic Outcomes (FOTO)  78 functional status measure      ROM / Strength   AROM / PROM / Strength AROM;Strength      AROM   AROM  Assessment Site Shoulder    Right/Left Shoulder Right    Right Shoulder Flexion 178 Degrees    Right Shoulder ABduction 167 Degrees    Right Shoulder Internal Rotation 81 Degrees    Right Shoulder External Rotation 76 Degrees      Strength   Strength Assessment Site Shoulder    Right/Left Shoulder Right    Right Shoulder Flexion 4+/5    Right Shoulder ABduction 4+/5    Right Shoulder Internal Rotation 4-/5    Right Shoulder External Rotation 4-/5      Palpation   Palpation comment TTP posterior scapular superficial and deep mm                        Objective measurements completed on examination: See above findings.       [facility] Adult PT Treatment/Exercise - 03/15/21 0001       Exercises   Exercises Shoulder      Shoulder Exercises: Standing   Horizontal ABduction 10 reps    Theraband Level (Shoulder Horizontal ABduction) Level 2 (Red)    Horizontal ABduction Limitations isometric walk outs    External Rotation 10 reps  Theraband Level (Shoulder External Rotation) Level 2 (Red)    Internal Rotation 10 reps    Theraband Level (Shoulder Internal Rotation) Level 2 (Red)    Other Standing Exercises horizontal adduction isometric walk outs red TB x 10      Shoulder Exercises: ROM/Strengthening   Plank 30 seconds    Ball on Wall 30 sec    Other ROM/Strengthening Exercises wall clock red TB x 10                    PT Education - 03/15/21 0927     Education Details PT POC and goals, HEP    Person(s) Educated Patient    Methods Explanation;Demonstration;Handout    Comprehension Returned demonstration;Verbalized understanding                 PT Long Term Goals - 03/15/21 0930       PT LONG TERM GOAL #1   Title Pt will be independent with HEP    Time 6    Period Weeks    Status New    Target Date 04/26/21      PT LONG TERM GOAL #2   Title Pt will return to playing ultimate frisbee with pain <= 2/10    Time 6    Period Weeks     Status New    Target Date 04/26/21      PT LONG TERM GOAL #3   Title FOTO will improve to >=83 to demo improved functional mobility    Time 6    Period Weeks    Status New    Target Date 04/26/21                    Plan - 03/15/21 0928     Clinical Impression Statement Pt is a 29 y/o female who is referred for Right shoulder pain. Pt presents with decreased Rt shoulder strength and stability and increased pain. pt will benefit from skilled PT to address deficits and improve functional mobility    Personal Factors and Comorbidities Fitness    Examination-Participation Restrictions Community Activity    Stability/Clinical Decision Making Stable/Uncomplicated    Clinical Decision Making Low    Rehab Potential Good    PT Frequency 1x / week    PT Duration 6 weeks    PT Treatment/Interventions Dry needling;Manual techniques;Vasopneumatic Device;Therapeutic activities;Therapeutic exercise;Neuromuscular re-education;Electrical Stimulation;Cryotherapy;Moist Heat;Iontophoresis 4mg/ml Dexamethasone;Taping    PT Next Visit Plan assess HEP, progress as tolerated    PT Home Exercise Plan [email or web address]    Consulted and Agree with Plan of Care Patient             Patient will benefit from skilled therapeutic intervention in order to improve the following deficits and impairments:  Pain, Impaired UE functional use, Decreased strength, Decreased activity tolerance  Visit Diagnosis: Acute pain of right shoulder - Plan: PT plan of care cert/re-cert  Muscle weakness (generalized) - Plan: PT plan of care cert/re-cert     Problem List There are no problems to display for this patient.  PHYSICAL THERAPY DISCHARGE SUMMARY  Visits from Start of Care: 1  Current functional level related to goals / functional outcomes: Not assessed/eval only   Remaining deficits: See above   Education / Equipment: HEP   Patient agrees to discharge. Patient goals were not met. Patient is being  discharged due to not returning since the last visit. Aaron Boeh, PT,DPT08/24/221:50 PM   Theodora Lalanne, PT  Cedricka Sackrider 03/15/2021, 11:01 AM    [facility] [address] [place], [place], [address] Phone: [phone]   Fax:  [phone]  Name: Narcissus Demeritt MRN: [id number] Date of Birth: 09/27/1990   

## 2021-03-15 NOTE — Patient Instructions (Signed)
Access Code: [email or web address] URL: [email or web address] Date: 03/15/2021 Prepared by: Reggy Eye  Exercises Standing Wall [place] Circles with Mini Swiss Breda - 1 x daily - 7 x weekly - 3 sets - 1 reps - 30sec to 1 min hold Standing Shoulder Adduction Reactive Isometrics with Resistance and Elbow Extended - 1 x daily - 7 x weekly - 3 sets - 10 reps Serratus Activation at Wall with Foam Roll and Resistance Band - 1 x daily - 7 x weekly - 3 sets - 10 reps Wall Clock with Theraband - 1 x daily - 7 x weekly - 3 sets - 10 reps Shoulder External Rotation with Anchored Resistance - 1 x daily - 7 x weekly - 3 sets - 10 reps Shoulder Internal Rotation with Resistance - 1 x daily - 7 x weekly - 3 sets - 10 reps Full Plank with Scapular Protraction Retraction AROM - 1 x daily - 7 x weekly - 3 sets - 1 reps - 30 sec hold

## 2021-04-09 ENCOUNTER — Other Ambulatory Visit: Payer: Self-pay

## 2021-04-09 ENCOUNTER — Ambulatory Visit (INDEPENDENT_AMBULATORY_CARE_PROVIDER_SITE_OTHER): Payer: No Typology Code available for payment source | Admitting: Sports Medicine

## 2021-04-09 DIAGNOSIS — M25511 Pain in right shoulder: Secondary | ICD-10-CM

## 2021-04-09 DIAGNOSIS — G8929 Other chronic pain: Secondary | ICD-10-CM | POA: Diagnosis not present

## 2021-04-09 DIAGNOSIS — S76311A Strain of muscle, fascia and tendon of the posterior muscle group at thigh level, right thigh, initial encounter: Secondary | ICD-10-CM

## 2021-04-09 MED ORDER — TRIAZOLAM 0.25 MG PO TABS: ORAL_TABLET | ORAL | 0 refills | Status: DC

## 2021-04-09 NOTE — Assessment & Plan Note (Signed)
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 She does have a bit of increased discomfort in her lumbar spine as well although I am able to reproduce her pain with direct palpation. I have advised the initial eccentric hamstring rehabilitation protocol. After 4 to 6 weeks if she has gotten really good at this we will start Nordic hamstring conditioning.

## 2021-04-09 NOTE — Assessment & Plan Note (Signed)
In addition Tracy Schneider has had 6 weeks of pain in her right shoulder, she plays ultimate Frisbee and does bouldering. Does not recall any discrete injuries, she did see Hyman Hopes, DNP, she was treated in an appropriate fashion, x-rays were obtained, she has pain posterior shoulder, worse with external rotation, abduction, cross arm movements. No overt mechanical symptoms, she has a positive O'Brien's test, positive Neer sign, positive crank test. No bicipital signs, rotator cuff strength is good. I do suspect she has a labral injury. We will go ahead and proceed with MR arthrography. She is probably go to get this done in September when her season ends. Triazolam for preprocedural anxiolysis

## 2021-04-09 NOTE — Progress Notes (Signed)
    Procedures performed today:    None.  Independent interpretation of notes and tests performed by another provider:   None.  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 She does have a bit of increased discomfort in her lumbar spine as well although I am able to reproduce her pain with direct palpation. I have advised the initial eccentric hamstring rehabilitation protocol. After 4 to 6 weeks if she has gotten really good at this we will start Nordic hamstring conditioning.  Chronic right shoulder pain In addition Tracy Schneider has had 6 weeks of pain in her right shoulder, she plays ultimate Frisbee and does bouldering. Does not recall any discrete injuries, she did see Hyman Hopes, DNP, she was treated in an appropriate fashion, x-rays were obtained, she has pain posterior shoulder, worse with external rotation, abduction, cross arm movements. No overt mechanical symptoms, she has a positive O'Brien's test, positive Neer sign, positive crank test. No bicipital signs, rotator cuff strength is good. I do suspect she has a labral injury. We will go ahead and proceed with MR arthrography. She is probably go to get this done in September when her season ends. Triazolam for preprocedural anxiolysis    ___________________________________________ Ihor Austin. Benjamin Stain, M.D., ABFM., CAQSM. Primary Care and Sports Medicine [facility]  Adjunct Instructor of Family Medicine  [employer]

## 2021-04-09 NOTE — Patient Instructions (Signed)
Initial Hamstring Rehab Protocol Hamstring curls: Start with 3 sets of 15 (no weight); Progress by 5 reps every 3 days until you reach 3 sets of 30; After 3 days at 3 sets of 30, add 2lb ankle weight at 3 sets of 10; Increase every 5 days by 5 reps. You may add 2lbs ankle weight once weekly. Hamstring swings- swing leg backwards and curl at the end of the swing. Follow same schedule as above. Hamstring running lunges- running lunge position means no more than 45 degrees of knee flexion and running motion. Follow same schedule as above. 

## 2021-04-22 ENCOUNTER — Ambulatory Visit: Payer: No Typology Code available for payment source | Admitting: Sports Medicine

## 2021-04-22 ENCOUNTER — Other Ambulatory Visit: Payer: No Typology Code available for payment source

## 2021-04-27 ENCOUNTER — Ambulatory Visit: Payer: No Typology Code available for payment source | Admitting: Professional

## 2021-04-29 ENCOUNTER — Other Ambulatory Visit: Payer: Self-pay

## 2021-04-29 ENCOUNTER — Ambulatory Visit: Payer: No Typology Code available for payment source | Admitting: Sports Medicine

## 2021-04-29 ENCOUNTER — Ambulatory Visit (INDEPENDENT_AMBULATORY_CARE_PROVIDER_SITE_OTHER): Payer: No Typology Code available for payment source | Admitting: Sports Medicine

## 2021-04-29 ENCOUNTER — Ambulatory Visit (INDEPENDENT_AMBULATORY_CARE_PROVIDER_SITE_OTHER): Payer: No Typology Code available for payment source

## 2021-04-29 ENCOUNTER — Other Ambulatory Visit: Payer: No Typology Code available for payment source

## 2021-04-29 DIAGNOSIS — G8929 Other chronic pain: Secondary | ICD-10-CM

## 2021-04-29 DIAGNOSIS — M25511 Pain in right shoulder: Secondary | ICD-10-CM | POA: Diagnosis not present

## 2021-04-29 NOTE — Progress Notes (Signed)
    Procedures performed today:    Procedure: Real-time Ultrasound Guided gadolinium contrast injection of right glenohumeral joint Device: Samsung HS60  Verbal informed consent obtained.  Time-out conducted.  Noted no overlying erythema, induration, or other signs of local infection.  Skin prepped in a sterile fashion.  Local anesthesia: Topical Ethyl chloride.  With sterile technique and under real time ultrasound guidance: I advanced a 22-gauge spinal needle into the glenohumeral joint from a posterior approach, I then injected 1 cc kenalog 40, 2 cc lidocaine, 2 cc bupivacaine, syringe again switched and 0.1 mL gadolinium injected, syringe again switched and 10 cc sterile saline used to distend the joint. Joint visualized and capsule seen distending confirming intra-articular placement of contrast material and medication. Completed without difficulty  Advised to call if fevers/chills, erythema, induration, drainage, or persistent bleeding.  Images permanently stored in PACS Impression: Technically successful ultrasound guided gadolinium contrast injection for MR arthrography.  Please see separate MR arthrogram report.  Independent interpretation of notes and tests performed by another provider:   None.  Brief History, Exam, Impression, and Recommendations:    Chronic right shoulder pain Please see prior notes for details on HPI, today we performed the injection for MR arthrography. Further management will be based on findings, suspicion was for labral injury.    ___________________________________________ Ihor Austin. Benjamin Stain, M.D., ABFM., CAQSM. Primary Care and Sports Medicine [facility]  Adjunct Instructor of Family Medicine  [employer]

## 2021-04-29 NOTE — Assessment & Plan Note (Signed)
Please see prior notes for details on HPI, today we performed the injection for MR arthrography. Further management will be based on findings, suspicion was for labral injury.

## 2021-05-03 ENCOUNTER — Ambulatory Visit (INDEPENDENT_AMBULATORY_CARE_PROVIDER_SITE_OTHER): Payer: No Typology Code available for payment source | Admitting: Professional

## 2021-05-03 ENCOUNTER — Other Ambulatory Visit (HOSPITAL_COMMUNITY): Payer: Self-pay

## 2021-05-03 DIAGNOSIS — F411 Generalized anxiety disorder: Secondary | ICD-10-CM | POA: Diagnosis not present

## 2021-06-14 ENCOUNTER — Ambulatory Visit (INDEPENDENT_AMBULATORY_CARE_PROVIDER_SITE_OTHER): Payer: No Typology Code available for payment source | Admitting: Professional

## 2021-06-14 DIAGNOSIS — F411 Generalized anxiety disorder: Secondary | ICD-10-CM | POA: Diagnosis not present

## 2021-06-20 ENCOUNTER — Telehealth: Payer: Self-pay | Admitting: Osteopathic Medicine

## 2021-06-20 NOTE — Telephone Encounter (Signed)
Patient would like to establish care with Christen Butter.

## 2021-06-25 DIAGNOSIS — Z0100 Encounter for examination of eyes and vision without abnormal findings: Secondary | ICD-10-CM

## 2021-07-23 ENCOUNTER — Other Ambulatory Visit (HOSPITAL_COMMUNITY): Payer: Self-pay

## 2021-08-02 ENCOUNTER — Ambulatory Visit (INDEPENDENT_AMBULATORY_CARE_PROVIDER_SITE_OTHER): Payer: No Typology Code available for payment source | Admitting: Professional

## 2021-08-02 DIAGNOSIS — F411 Generalized anxiety disorder: Secondary | ICD-10-CM

## 2021-09-04 ENCOUNTER — Encounter: Payer: Self-pay | Admitting: Sports Medicine

## 2021-09-04 DIAGNOSIS — L989 Disorder of the skin and subcutaneous tissue, unspecified: Secondary | ICD-10-CM | POA: Insufficient documentation

## 2021-09-04 NOTE — Assessment & Plan Note (Signed)
Referral to [facility] dermatology per patient request for skin checks.

## 2021-09-11 ENCOUNTER — Other Ambulatory Visit (HOSPITAL_COMMUNITY): Payer: Self-pay

## 2021-09-12 ENCOUNTER — Other Ambulatory Visit (HOSPITAL_COMMUNITY): Payer: Self-pay

## 2021-09-12 MED ORDER — METRONIDAZOLE 0.75 % EX CREA
TOPICAL_CREAM | CUTANEOUS | 2 refills | Status: DC
  Filled 2021-09-12: qty 45, 30d supply, fill #0
  Filled 2022-05-26: qty 45, 30d supply, fill #1

## 2021-09-13 ENCOUNTER — Encounter: Payer: Self-pay | Admitting: Professional

## 2021-09-13 ENCOUNTER — Ambulatory Visit (INDEPENDENT_AMBULATORY_CARE_PROVIDER_SITE_OTHER): Payer: No Typology Code available for payment source | Admitting: Professional

## 2021-09-13 DIAGNOSIS — F411 Generalized anxiety disorder: Secondary | ICD-10-CM | POA: Insufficient documentation

## 2021-09-13 NOTE — Progress Notes (Signed)
[facility] Counselor/Therapist Progress Note  Patient ID: Tracy Schneider, MRN: [id number],    Date: 09/13/2021  Time Spent: 46 minutes 805-851 am  Treatment Type: Individual Therapy  Reported Symptoms: calm and engaged   Mental Status Exam: Appearance:  Casual     Behavior: Appropriate and Sharing  Motor: Normal  Speech/Language:  Clear and Coherent and Normal Rate  Affect: Full Range  Mood: normal  Thought process: goal directed  Thought content:   WNL  Sensory/Perceptual disturbances:   WNL  Orientation: oriented to person, place, time/date, and situation  Attention: Good  Concentration: Good  Memory: WNL  Fund of knowledge:  Good  Insight:   Good  Judgment:  Good  Impulse Control: Good   Risk Assessment: Danger to Self:  No Self-injurious Behavior: No Danger to Others: No Duty to Warn:no Physical Aggression / Violence:No  Access to Firearms a concern: No  Gang Involvement:No   Subjective: This session was held via video teletherapy due to the coronavirus risk at this time. The patient consented to video teletherapy and was located at her home during this session. She is aware it is the responsibility of the patient to secure confidentiality on her end of the session. The provider was in a private home office for the duration of this session.   Issues addressed: 1- Christmas -spent with her father and stepmother in [place] -her stepmother changed shifts to days and the visit was so much more pleasant -patient talked with dad about a clock in his home that he said was his   -her mother had showed her a picture from Christmas Day with her grandparents when she gave them the clock     -after her grandparents death the clock was in their home     -her father was angry that he would be losing something yet again to her mother       -he would not give up the clock -he gave her a really long hug -she felt better because she said her piece   -she discussed his  trauma and how he could benefit 2-personal growth -able to address difficult subjects with her father  -she read through email messages from when her father was deployed and she was a teenager   -she felt the "need to know" what was said 3-struggles -after spending time with father went to [place] to a friend's wedding -the friend's family is very inclusive   -pt got sad that her family will never be like this -the patient is planning to elope and have a big party -her father told her to do what she wants 4-future -Trinna Post has signed a two year contract in a fellowship for additional training in health administration   -his goal is to stay in academia -they are looking to buy a house   Problems Addressed  Anxiety, Childhood Trauma  Goals 1. Learn and implement coping skills that result in a reduction of anxiety and worry, and improved daily functioning. Objective Learn to accept limitations in life and commit to tolerating, rather than avoiding, unpleasant emotions while accomplishing meaningful goals. Target Date: 2022-09-12 Frequency: Monthly  Progress: 60 Modality: individual  Related Interventions Use techniques from Acceptance and Commitment Therapy to help client accept uncomfortable realities such as lack of complete control, imperfections, and uncertainty and tolerate unpleasant emotions and thoughts in order to accomplish value-consistent goals. Objective Learn and implement a strategy to limit the association between various environmental settings and worry, delaying the worry until  a designated "worry time." Target Date: 2022-09-12 Frequency: Monthly  Progress: 60 Modality: individual  Related Interventions Teach the client how to recognize, stop, and postpone worry to the agreed upon worry time using skills such as thought stopping, relaxation, and redirecting attention (or assign "Making Use of the Thought-Stopping Technique" and/or "Worry Time" in the Adult Psychotherapy  Homework Planner by Jongsma to assist skill development); encourage use in daily life; review and reinforce success while providing corrective feedback toward improvement. Objective Learn and implement personal and interpersonal skills to reduce anxiety and improve interpersonal relationships. Target Date: 2022-09-12 Frequency: Monthly  Progress: 44 Modality: individual  Related Interventions Use instruction, modeling, and role-playing to build the client's general social, communication, and/or conflict resolution skills. Objective Learn and implement problem-solving strategies for realistically addressing worries. Target Date: 2022-09-12 Frequency: Monthly  Progress: 60 Modality: individual  Related Interventions Teach the client problem-solving strategies involving specifically defining a problem, generating options for addressing it, evaluating the pros and cons of each option, selecting and implementing an optional action, and reevaluating and refining the action (or assign "Applying Problem-Solving to Interpersonal Conflict" in the Adult Psychotherapy Homework Planner by Stephannie Li). Objective Learn and implement calming skills to reduce overall anxiety and manage anxiety symptoms. Target Date: 2022-09-12 Frequency: Monthly  Progress: 60 Modality: individual  Related Interventions Teach the client calming/relaxation skills (e.g., applied relaxation, progressive muscle relaxation, cue controlled relaxation; mindful breathing; biofeedback) and how to discriminate better between relaxation and tension; teach the client how to apply these skills to his/her daily life (e.g., New Directions in Progressive Muscle Relaxation by Marcelyn Ditty, and Hazlett-Stevens; Treating Generalized Anxiety Disorder by Rygh and Ida Rogue). 2. Let go of blame and begin to forgive others for pain caused in childhood. Objective Increase level of trust of others as shown by more socialization and greater intimacy  tolerance. Target Date: 2022-09-12 Frequency: Monthly  Progress: 40 Modality: individual  Related Interventions Teach the client the share-check method of building trust in relationships (sharing a little information and checking as to the recipient's sensitivity in reacting to that information). Teach the client the advantages of treating people as trustworthy given a reasonable amount of time to assess their character. Objective Decrease statements of being a victim while increasing statements that reflect personal empowerment. Target Date: 2022-09-12 Frequency: Monthly  Progress: 30 Modality: individual  Related Interventions Ask the client to complete an exercise that identifies the positives and negatives of being a victim and the positives and negatives of being a survivor; compare and process the lists. Encourage and reinforce the client's statements that reflect movement away from viewing self as a victim and toward personal empowerment as a survivor (or assign "Changing from Victim to Survivor" in the Adult Psychotherapy Homework Planner by Stephannie Li). 3. Reduce overall frequency, intensity, and duration of the anxiety so that daily functioning is not impaired. 4. Release the emotions associated with past childhood/family issues, resulting in less resentment and more serenity. 5. Resolve past childhood/family issues, leading to less anger and depression, greater self-esteem, security, and confidence. 6. Resolve the core conflict that is the source of anxiety.  Diagnosis:Generalized anxiety disorder  Plan:  -meet again on Thursday, October 31, 2021 at 8am  [place], [facility]

## 2021-09-13 NOTE — Progress Notes (Deleted)
° ° ° ° ° ° ° ° ° ° ° ° ° ° °  Issacc Merlo, LCMHC °

## 2021-10-10 ENCOUNTER — Other Ambulatory Visit (HOSPITAL_COMMUNITY): Payer: Self-pay

## 2021-10-31 ENCOUNTER — Encounter: Payer: Self-pay | Admitting: Professional

## 2021-10-31 ENCOUNTER — Ambulatory Visit (INDEPENDENT_AMBULATORY_CARE_PROVIDER_SITE_OTHER): Payer: No Typology Code available for payment source | Admitting: Professional

## 2021-10-31 DIAGNOSIS — F411 Generalized anxiety disorder: Secondary | ICD-10-CM

## 2021-10-31 NOTE — Progress Notes (Signed)
[facility] Counselor/Therapist Progress Note ? ?Patient ID: Seana Underwood, MRN: [id number],   ? ?Date: 10/31/2021 ? ?Time Spent: 27 minutes 803-825 am ? ?Treatment Type: Individual Therapy ? ?Reported Symptoms: calm and engaged ? ? ?Mental Status Exam: ?Appearance:  Casual     ?Behavior: Appropriate and Sharing  ?Motor: Normal  ?Speech/Language:  Clear and Coherent and Normal Rate  ?Affect: Full Range  ?Mood: normal  ?Thought process: goal directed  ?Thought content:   WNL  ?Sensory/Perceptual disturbances:   WNL  ?Orientation: oriented to person, place, time/date, and situation  ?Attention: Good  ?Concentration: Good  ?Memory: WNL  ?Fund of knowledge:  Good  ?Insight:   Good  ?Judgment:  Good  ?Impulse Control: Good  ? ?Risk Assessment: ?Danger to Self:  No ?Self-injurious Behavior: No ?Danger to Others: No ?Duty to Warn:no ?Physical Aggression / Violence:No  ?Access to Firearms a concern: No  ?Gang Involvement:No  ? ?Subjective: This session was held via video teletherapy due to the coronavirus risk at this time. The patient consented to video teletherapy and was located at her home during this session. She is aware it is the responsibility of the patient to secure confidentiality on her end of the session. The provider was in a private home office for the duration of this session.  ? ?The patient arrived on time for her appointment. ? ?Issues addressed: ?1- personal ?-she and her fiance Trinna Post bought their house in [place] ?  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  Admission on 09/01/19   4          10  ?  10/31/2021                          2            4 ?4-medication ?-wants to eventually go off the  Lexapro ?  -supported pt's decision if she chooses to try in future under medical supervision ?  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 ?Problems Addressed  ?Anxiety, Childhood Trauma  ?Goals ?1. Learn and implement coping skills that result in a reduction of anxiety and worry, and improved daily functioning. ?Objective ?Learn to accept limitations in life and commit to tolerating, rather than avoiding, unpleasant emotions while accomplishing meaningful goals. ?Target Date: 2022-09-12 Frequency: Monthly  ?Progress: 60 Modality: individual  ?Related Interventions ?Use techniques from Acceptance and Commitment Therapy to help client accept uncomfortable realities such as lack of complete control, imperfections, and uncertainty and tolerate unpleasant emotions and thoughts in order to accomplish value-consistent goals. ?Objective ?Learn and implement a strategy to limit the association between various environmental settings and worry, delaying the worry until a designated "worry time." ?Target Date: 2022-09-12 Frequency: Monthly  ?Progress: 60 Modality: individual  ?Related Interventions ?Teach the client how to recognize, stop, and postpone worry to the agreed upon worry time using skills such as thought stopping, relaxation, and redirecting attention (or assign "Making Use of the Thought-Stopping Technique" and/or "Worry Time" in the Adult Psychotherapy Homework Planner by Jongsma to assist skill development); encourage use  in daily life; review and reinforce success while providing corrective feedback toward improvement. ?Objective ?Learn and implement personal and interpersonal skills to reduce anxiety and improve interpersonal relationships. ?Target Date: 2022-09-12 Frequency: Monthly  ?Progress: 70 Modality: individual  ?Related Interventions ?Use instruction, modeling, and role-playing to  build the client's general social, communication, and/or conflict resolution skills. ?Objective ?Learn and implement problem-solving strategies for realistically addressing worries. ?Target Date: 2022-09-12 Frequency: Monthly  ?Progress: 60 Modality: individual  ?Related Interventions ?Teach the client problem-solving strategies involving specifically defining a problem, generating options for addressing it, evaluating the pros and cons of each option, selecting and implementing an optional action, and reevaluating and refining the action (or assign "Applying Problem-Solving to Interpersonal Conflict" in the Adult Psychotherapy Homework Planner by Stephannie Li). ?Objective ?Learn and implement calming skills to reduce overall anxiety and manage anxiety symptoms. ?Target Date: 2022-09-12 Frequency: Monthly  ?Progress: 60 Modality: individual  ?Related Interventions ?Teach the client calming/relaxation skills (e.g., applied relaxation, progressive muscle relaxation, cue controlled relaxation; mindful breathing; biofeedback) and how to discriminate better between relaxation and tension; teach the client how to apply these skills to his/her daily life (e.g., New Directions in Progressive Muscle Relaxation by Marcelyn Ditty, and Hazlett-Stevens; Treating Generalized Anxiety Disorder by Rygh and Ida Rogue). ?2. Let go of blame and begin to forgive others for pain caused in childhood. ?Objective ?Increase level of trust of others as shown by more socialization and greater intimacy tolerance. ?Target Date: 2022-09-12 Frequency: Monthly  ?Progress: 40 Modality: individual  ?Related Interventions ?Teach the client the share-check method of building trust in relationships (sharing a little information and checking as to the recipient's sensitivity in reacting to that information). ?Teach the client the advantages of treating people as trustworthy given a reasonable amount of time to assess their character. ?Objective ?Decrease  statements of being a victim while increasing statements that reflect personal empowerment. ?Target Date: 2022-09-12 Frequency: Monthly  ?Progress: 70 Modality: individual  ?Related Interventions ?Ask the client to complete an exercise that identifies the positives and negatives of being a victim and the positives and negatives of being a survivor; compare and process the lists. ?Encourage and reinforce the client's statements that reflect movement away from viewing self as a victim and toward personal empowerment as a survivor (or assign "Changing from Victim to Survivor" in the Adult Psychotherapy Homework Planner by Stephannie Li). ?3. Reduce overall frequency, intensity, and duration of the anxiety so that daily functioning is not impaired. ?4. Release the emotions associated with past childhood/family issues, resulting in less resentment and more serenity. ?5. Resolve past childhood/family issues, leading to less anger and depression, greater self-esteem, security, and confidence. ?6. Resolve the core conflict that is the source of anxiety. ? ?Diagnosis:Generalized anxiety disorder ? ?Plan:  ?-return as needed ? ?Teofilo Pod, [facility] ?

## 2021-12-20 ENCOUNTER — Ambulatory Visit (INDEPENDENT_AMBULATORY_CARE_PROVIDER_SITE_OTHER): Payer: No Typology Code available for payment source | Admitting: Sports Medicine

## 2021-12-20 ENCOUNTER — Ambulatory Visit (INDEPENDENT_AMBULATORY_CARE_PROVIDER_SITE_OTHER): Payer: No Typology Code available for payment source

## 2021-12-20 DIAGNOSIS — M25551 Pain in right hip: Secondary | ICD-10-CM

## 2021-12-20 DIAGNOSIS — G8929 Other chronic pain: Secondary | ICD-10-CM | POA: Diagnosis not present

## 2021-12-20 DIAGNOSIS — Z0000 Encounter for general adult medical examination without abnormal findings: Secondary | ICD-10-CM | POA: Diagnosis not present

## 2021-12-20 NOTE — Assessment & Plan Note (Signed)
Patient has switched to me his primary, annual physical as above, ordering routine labs. ?Up-to-date on cervical cancer screening, she does go to [place]. ? ?

## 2021-12-20 NOTE — Progress Notes (Signed)
?Subjective:   ? ?CC: Annual Physical Exam ? ?HPI:  ?This patient is here for their annual physical ? ?I reviewed the past medical history, family history, social history, surgical history, and allergies today and no changes were needed.  Please see the problem list section below in epic for further details. ? ?Past Medical History: ?No past medical history on file. ?Past Surgical History: ?Past Surgical History:  ?Procedure Laterality Date  ? RHINOPLASTY  10/2005  ? septorhinoplasty  ? ?Social History: ?Social History  ? ?Socioeconomic History  ? Marital status: Single  ?  Spouse name: Not on file  ? Number of children: Not on file  ? Years of education: Not on file  ? Highest education level: Not on file  ?Occupational History  ?  Employer: [employer]  ?Tobacco Use  ? Smoking status: Never  ? Smokeless tobacco: Never  ?Vaping Use  ? Vaping Use: Never used  ?Substance and Sexual Activity  ? Alcohol use: Not Currently  ? Drug use: Never  ? Sexual activity: Yes  ?  Partners: Male  ?  Birth control/protection: Inserts  ?Other Topics Concern  ? Not on file  ?Social History Narrative  ? Not on file  ? ?Social Determinants of Health  ? ?Financial Resource Strain: Not on file  ?Food Insecurity: Not on file  ?Transportation Needs: Not on file  ?Physical Activity: Not on file  ?Stress: Not on file  ?Social Connections: Not on file  ? ?Family History: ?Family History  ?Problem Relation Age of Onset  ? High blood pressure Maternal Grandmother   ? Stroke Paternal Grandmother   ? Colon cancer Paternal Grandfather   ? Healthy Mother   ? Healthy Father   ? ?Allergies: ?No Known Allergies ?Medications: See med rec. ? ?Review of Systems: No headache, visual changes, nausea, vomiting, diarrhea, constipation, dizziness, abdominal pain, skin rash, fevers, chills, night sweats, swollen lymph nodes, weight loss, chest pain, body aches, joint swelling, muscle aches, shortness of breath, mood changes, visual or auditory  hallucinations. ? ?Objective:   ? ?General: Well Developed, well nourished, and in no acute distress.  ?Neuro: Alert and oriented x3, extra-ocular muscles intact, sensation grossly intact. Cranial nerves II through XII are intact, motor, sensory, and coordinative functions are all intact. ?HEENT: Normocephalic, atraumatic, pupils equal round reactive to light, neck supple, no masses, no lymphadenopathy, thyroid nonpalpable. Oropharynx, nasopharynx, external ear canals are unremarkable. ?Skin: Warm and dry, no rashes noted.  ?Cardiac: Regular rate and rhythm, no murmurs rubs or gallops.  ?Respiratory: Clear to auscultation bilaterally. Not using accessory muscles, speaking in full sentences.  ?Abdominal: Soft, nontender, nondistended, positive bowel sounds, no masses, no organomegaly.  ?Right hip: Positive FADIR sign, pain with internal rotation.  No pain with resisted hip flexion. ? ?Impression and Recommendations:   ? ?The patient was counselled, risk factors were discussed, anticipatory guidance given. ? ?Annual physical exam ?Patient has switched to me his primary, annual physical as above, ordering routine labs. ?Up-to-date on cervical cancer screening, she does go to [facility]. ? ? ?Chronic right hip pain ?31 year old female, many months of pain right anterior hip, typically worse with flexion, adduction, internal rotation. ?On exam she has a positive FADIR sign, I am not able to reproduce the pain with resisted hip flexion, she does endorse the C sign when describing her pain. ?This is all consistent with a hip labral tear, we will start conservatively, she has some ibuprofen that she can use, adding physical therapy, x-rays,  in 6 weeks if not better we will proceed with MR arthrography. ? ?___________________________________________ ?Ihor Austin. Benjamin Stain, M.D., ABFM., CAQSM. ?Primary Care and Sports Medicine ?[facility] ? ?Adjunct Professor of Family Medicine  ?[employer] ?

## 2021-12-20 NOTE — Assessment & Plan Note (Signed)
31 year old female, many months of pain right anterior hip, typically worse with flexion, adduction, internal rotation. ?On exam she has a positive FADIR sign, I am not able to reproduce the pain with resisted hip flexion, she does endorse the C sign when describing her pain. ?This is all consistent with a hip labral tear, we will start conservatively, she has some ibuprofen that she can use, adding physical therapy, x-rays, in 6 weeks if not better we will proceed with MR arthrography. ?

## 2022-01-03 ENCOUNTER — Ambulatory Visit
Payer: No Typology Code available for payment source | Attending: Sports Medicine | Admitting: Rehabilitative and Restorative Service Providers"

## 2022-01-03 ENCOUNTER — Encounter: Payer: Self-pay | Admitting: Rehabilitative and Restorative Service Providers"

## 2022-01-03 DIAGNOSIS — M25551 Pain in right hip: Secondary | ICD-10-CM | POA: Insufficient documentation

## 2022-01-03 DIAGNOSIS — R1031 Right lower quadrant pain: Secondary | ICD-10-CM | POA: Diagnosis not present

## 2022-01-03 DIAGNOSIS — G8929 Other chronic pain: Secondary | ICD-10-CM | POA: Insufficient documentation

## 2022-01-03 DIAGNOSIS — R29898 Other symptoms and signs involving the musculoskeletal system: Secondary | ICD-10-CM | POA: Diagnosis not present

## 2022-01-03 NOTE — Therapy (Signed)
[facility] ?[address] [place] [address] ?[place], [place], [address] ?Phone: [phone]   Fax:  [phone] ? ?Physical Therapy Evaluation ? ?Patient Details  ?Name: Tracy Schneider ?MRN: [id number] ?Date of Birth: 03/10/1991 ?Referring Provider (PT): Dr Benjamin Stain ? ? ?Encounter Date: 01/03/2022 ? ? PT End of Session - 01/03/22 1156   ? ? Visit Number 1   ? Number of Visits 12   ? Date for PT Re-Evaluation 02/14/22   ? PT Start Time 0940   late for 9:30 appt  ? PT Stop Time 1015   ? PT Time Calculation (min) 35 min   ? Activity Tolerance Patient tolerated treatment well   ? ?  ?  ? ?  ? ? ?History reviewed. No pertinent past medical history. ? ?Past Surgical History:  ?Procedure Laterality Date  ? RHINOPLASTY  10/2005  ? septorhinoplasty  ? ? ?There were no vitals filed for this visit. ? ? ? Subjective Assessment - 01/03/22 0942   ? ? Subjective Patient reports that she has had groin/pain for the past couple of years. She plays ultimate competetive frisbee golf. She also has had hamstring and LBP over the past few years. She will rest and symptoms improve. About 6 weeks ago she started having pubic pain which was intense with pressure and pain in the pubic area and into the Rt groin.   ? Pertinent History history of some intermittent LBP and hip   ? Diagnostic tests x-ray-1. Normal appearance of the bilateral femoroacetabular joints.  2. There is a normal variant right-sided lumbosacral transitional  assimilation joint with mild associated degenerative change. This  can be associated with right lower back pain and/or right hip pain.   ? Patient Stated Goals get rid of the pain and return to sports   ? Currently in Pain? Yes   ? Pain Score 2    ? Pain Location Groin   ? Pain Orientation Right   ? Pain Descriptors / Indicators Dull   becomes sharper with cutting and sprinting  ? Pain Type Acute pain;Chronic pain   ? Pain Radiating Towards pubic bone; Rt side LB   ? Pain Onset  More than a month ago   ? Pain Frequency Intermittent   ? Aggravating Factors  cutting; running; sitting prolonged periods of time; freesbie   ? Pain Relieving Factors rest lying down any position; some improvement with OTC NSAIDs for acute pain  then back to baseline   ? ?  ?  ? ?  ? ? ? ? ? [facility] PT Assessment - 01/03/22 0001   ? ?  ? Assessment  ? Medical Diagnosis Rt groin pain; Rt LBP   ? Referring Provider (PT) Dr Benjamin Stain   ? Onset Date/Surgical Date 11/13/21   symptoms present on an intermittent basis for ~ 2 years  ? Hand Dominance Right   ? Next MD Visit to schedule after PT course   ? Prior Therapy one visit for shoulder injury   ?  ? Precautions  ? Precautions None   ?  ? Restrictions  ? Weight Bearing Restrictions No   ?  ? Balance Screen  ? Has the patient fallen in the past 6 months No   ? Has the patient had a decrease in activity level because of a fear of falling?  No   ? Is the patient reluctant to leave their home because of a fear of falling?  No   ?  ? Home  Environment  ? Living Environment Private residence   ?  ? Prior Function  ? Level of Independence Independent   ? Vocation Full time employment   ? 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 Leisure workout - cardio, lifting free weights ~ 2 days/each; household chores; frisbee golf   ?  ? Observation/Other Assessments  ? Focus on Therapeutic Outcomes (FOTO)  64   ?  ? Sensation  ? Additional Comments WFL's per pt report   ?  ? Posture/Postural Control  ? Posture Comments head forward; shoulders rounded and elevated; hyperextension bilal knees; hips forward slightly   ?  ? AROM  ? Overall AROM Comments tight end range Rt hip extension with pull in the anterior hip/groin; tight hip flexion and IR   ? Lumbar Flexion 80% tight HS   ? Lumbar Extension 70% pain in the Rt SI area dull aching   ? Lumbar - Right Side Bend 90% Rt SI discomfort   ? Lumbar - Left Side Bend 100%   ? Lumbar - Right Rotation  45%   ? Lumbar - Left Rotation 50%   ?  ? Strength  ? Overall Strength Comments functional strength; not tested resistively   ?  ? Palpation  ? Spinal mobility no tenderness or tightness noted through the LB of Rt SI   ? SI assessment  pain Rt SI with forward lumbar flexion and with Rt lateral flexion   ? Palpation comment muscular tightness Rt anterior hip in psoas and hip flexors into the quads; Rt SI area   ? ?  ?  ? ?  ? ? ? ? ? ? ? ? ? ? ? ? ? ?Objective measurements completed on examination: See above findings.  ? ? ? ? ? [facility] Adult PT Treatment/Exercise - 01/03/22 0001   ? ?  ? Neuro Re-ed   ? Neuro Re-ed Details  encouraged patient to avoid standing with knees hyperextended   ?  ? Knee/Hip Exercises: Stretches  ? Quad Stretch Right;2 reps;30 seconds   ? Quad Stretch Limitations prone with strap   ? Hip Flexor Stretch Right;3 reps;30 seconds   ? Hip Flexor Stretch Limitations supine thomas; repeated sitting   ? ?  ?  ? ?  ? ? ? ? ? ? ? ? ? ? PT Education - 01/03/22 1011   ? ? Education Details POC HEP DN   ? Person(s) Educated Patient   ? Methods Explanation;Demonstration;Tactile cues;Verbal cues;Handout   ? Comprehension Verbalized understanding;Returned demonstration;Verbal cues required;Tactile cues required   ? ?  ?  ? ?  ? ? ? ? ? ? PT Long Term Goals - 01/03/22 1203   ? ?  ? PT LONG TERM GOAL #1  ? Title Decrease Rt groin/hip/LB pain by 75-100% allowing patient to resume normal functional activities including frisbee   ? Time 6   ? Period Weeks   ? Status New   ? Target Date 02/14/22   ?  ? PT LONG TERM GOAL #2  ? Title Further assessment of physical requirements for ultimate frisbee with appripriate recommendations for modifications and conditioning   ? Time 6   ? Period Weeks   ? Status New   ? Target Date 02/14/22   ?  ? PT LONG TERM GOAL #3  ? Title Improve mobility with decreased palpable tightness/pain in the Rt lower quadrant   ? Time 6   ? Period  Weeks   ? Status New   ? Target Date 02/14/22   ?   ? PT LONG TERM GOAL #4  ? Title Indpendent in HEP(including aquatic program as indicated)   ? Time 6   ? Period Weeks   ? Status New   ? Target Date 02/14/22   ?  ? PT LONG TERM GOAL #5  ? Title Improve functional limitation score to 82   ? Time 6   ? Period Weeks   ? Status New   ? Target Date 02/14/22   ? ?  ?  ? ?  ? ? ? ? ? ? ? ? ? Plan - 01/03/22 1157   ? ? Clinical Impression Statement Patient presents with ~ 6 week history of Rt groin/hip pain with a history of ~ 2 years of intermittent Rt LB and Rt hip pain which she feels may be related to competitive ultimate frisbee. She has pain with prolonged sitting; running; cutting when running; moving lying to sit. Patient has poor posture and alignment; limited Rt hip mobilty/ROM; RtSI pain with loading; significant muscular tightness through the Rt anterior hip complex including iliopsoas, quads, adductors. Patient will benefit from PT to address problems identified.   ? Stability/Clinical Decision Making Stable/Uncomplicated   ? Clinical Decision Making Low   ? Rehab Potential Good   ? PT Frequency 2x / week   ? PT Duration 6 weeks   ? PT Treatment/Interventions ADLs/Self Care Home Management;Aquatic Therapy;Cryotherapy;Electrical Stimulation;Iontophoresis 4mg /ml Dexamethasone;Moist Heat;Ultrasound;Therapeutic activities;Therapeutic exercise;Balance training;Neuromuscular re-education;Patient/family education;Manual techniques;Dry needling;Taping   ? PT Next Visit Plan revies and progress exercises (trial of half kneeling with diagonals); myofacial ball release work anterior hip; DN and/or manual work through hip flexors and adductors as incidated; modalities aas indicated. Will need to assess mechanics of frisbee throw as symptoms subside.   ? PT Home Exercise Plan [email or web address]   ? Consulted and Agree with Plan of Care Patient   ? ?  ?  ? ?  ? ? ?Patient will benefit from skilled therapeutic intervention in order to improve the following deficits and impairments:   Decreased range of motion, Decreased activity tolerance, Pain, Improper body mechanics, Impaired flexibility, Decreased mobility, Decreased strength, Postural dysfunction ? ?Visit Diagnosis: ?Groin p

## 2022-01-03 NOTE — Patient Instructions (Signed)
Access Code: [email or web address] ?URL: [email or web address] ?Date: 01/03/2022 ?Prepared by: Corlis Leak ? ?Exercises ?- Hip Flexor Stretch at Edge of Bed  - 2 x daily - 7 x weekly - 1 sets - 3 reps - 30 sec  hold ?- Seated Hip Flexor Stretch  - 2 x daily - 7 x weekly - 1 sets - 3 reps - 30 sec  hold ?- Prone Quadriceps Stretch with Strap  - 2 x daily - 7 x weekly - 1 sets - 3 reps - 30 sec  hold ? ?Patient Education ?- Trigger Point Dry Needling ?

## 2022-01-06 ENCOUNTER — Other Ambulatory Visit (HOSPITAL_COMMUNITY): Payer: Self-pay

## 2022-01-06 ENCOUNTER — Other Ambulatory Visit: Payer: Self-pay | Admitting: Osteopathic Medicine

## 2022-01-06 ENCOUNTER — Encounter: Payer: Self-pay | Admitting: Rehabilitative and Restorative Service Providers"

## 2022-01-06 ENCOUNTER — Ambulatory Visit: Payer: No Typology Code available for payment source | Admitting: Rehabilitative and Restorative Service Providers"

## 2022-01-06 DIAGNOSIS — M25551 Pain in right hip: Secondary | ICD-10-CM | POA: Diagnosis not present

## 2022-01-06 DIAGNOSIS — M25511 Pain in right shoulder: Secondary | ICD-10-CM

## 2022-01-06 DIAGNOSIS — R1031 Right lower quadrant pain: Secondary | ICD-10-CM

## 2022-01-06 DIAGNOSIS — M6281 Muscle weakness (generalized): Secondary | ICD-10-CM

## 2022-01-06 DIAGNOSIS — R29898 Other symptoms and signs involving the musculoskeletal system: Secondary | ICD-10-CM

## 2022-01-06 MED ORDER — NORGESTIMATE-ETH ESTRADIOL 0.25-35 MG-MCG PO TABS
1.0000 | ORAL_TABLET | Freq: Every day | ORAL | 3 refills | Status: DC
  Filled 2022-01-06: qty 84, 84d supply, fill #0

## 2022-01-06 MED ORDER — ESCITALOPRAM OXALATE 20 MG PO TABS
20.0000 mg | ORAL_TABLET | Freq: Every day | ORAL | 3 refills | Status: DC
  Filled 2022-01-06: qty 90, 90d supply, fill #0
  Filled 2022-05-26: qty 90, 90d supply, fill #1

## 2022-01-06 NOTE — Patient Instructions (Signed)
Access Code: [email or web address] ?URL: [email or web address] ?Date: 01/06/2022 ?Prepared by: Corlis Leak ? ?Exercises ?- Hip Flexor Stretch at Edge of Bed  - 2 x daily - 7 x weekly - 1 sets - 3 reps - 30 sec  hold ?- Seated Hip Flexor Stretch  - 2 x daily - 7 x weekly - 1 sets - 3 reps - 30 sec  hold ?- Prone Quadriceps Stretch with Strap  - 2 x daily - 7 x weekly - 1 sets - 3 reps - 30 sec  hold ?- Supine Hip Adductor Stretch  - 2 x daily - 7 x weekly - 1 sets - 3 reps - 30 sec  hold ?- Hip Adductors and Hamstring Stretch with Strap  - 2 x daily - 7 x weekly - 1 sets - 3 reps - 30 sec  hold ?- Supine Transversus Abdominis Bracing with Pelvic Floor Contraction  - 2 x daily - 7 x weekly - 1 sets - 10 reps - 10sec  hold ?

## 2022-01-06 NOTE — Therapy (Signed)
[facility] ?[address] [place] [address] ?[place], [place], [address] ?Phone: [phone]   Fax:  [phone] ? ?Physical Therapy Treatment ? ?Patient Details  ?Name: Tracy Schneider ?MRN: [id number] ?Date of Birth: October 08, 1990 ?Referring Provider (PT): Dr Benjamin Stain ? ? ?Encounter Date: 01/06/2022 ? ? PT End of Session - 01/06/22 1962   ? ? Visit Number 2   ? Number of Visits 12   ? Date for PT Re-Evaluation 02/14/22   ? PT Start Time [phone]   ? PT Stop Time 0935   ? PT Time Calculation (min) 48 min   ? Activity Tolerance Patient tolerated treatment well   ? ?  ?  ? ?  ? ? ?History reviewed. No pertinent past medical history. ? ?Past Surgical History:  ?Procedure Laterality Date  ? RHINOPLASTY  10/2005  ? septorhinoplasty  ? ? ?There were no vitals filed for this visit. ? ? Subjective Assessment - 01/06/22 0856   ? ? Subjective Worked out yesterday and did not have the pelvic pain that she normally does. Still has some tightness in the Rt hip area. Working on her exercises at home.   ? Currently in Pain? No/denies   ? Pain Score 0-No pain   ? Pain Location Groin   ? Pain Orientation Right   ? ?  ?  ? ?  ? ? ? ? ? ? ? ? ? ? ? ? ? ? ? ? ? ? ? ? [facility] Adult PT Treatment/Exercise - 01/06/22 0001   ? ?  ? Self-Care  ? Other Self-Care Comments  myofacial ball release work prone and standing   ?  ? Knee/Hip Exercises: Stretches  ? Quad Stretch Right;2 reps;30 seconds   ? Quad Stretch Limitations prone with strap   ? Hip Flexor Stretch Right;3 reps;30 seconds   ? Hip Flexor Stretch Limitations supine thomas; repeated sitting   ? Other Knee/Hip Stretches adductor stretch supine with strap PT assist to hold LE 30 sec x 3 reps   ? Other Knee/Hip Stretches butterfly stretch 30 sec x 3 reps   ?  ? Knee/Hip Exercises: Supine  ? Quad Sets Limitations 4 part core 10 sec hold x 10 reps   ? Other Supine Knee/Hip Exercises alternate knee drop in hooklying x 10-15 reps   ?  ? Cryotherapy  ? Number  Minutes Cryotherapy 10 Minutes   ? Cryotherapy Location Hip   ? Type of Cryotherapy Ice pack   ?  ? Manual Therapy  ? Soft tissue mobilization deep tissue work through the psoas, hip flexors, adductors Rt side   ? ?  ?  ? ?  ? ? ? ? ? ? ? ? ? ? PT Education - 01/06/22 0931   ? ? Education Details HEP ball release work   ? ?  ?  ? ?  ? ? ? ? ? ? PT Long Term Goals - 01/03/22 1203   ? ?  ? PT LONG TERM GOAL #1  ? Title Decrease Rt groin/hip/LB pain by 75-100% allowing patient to resume normal functional activities including frisbee   ? Time 6   ? Period Weeks   ? Status New   ? Target Date 02/14/22   ?  ? PT LONG TERM GOAL #2  ? Title Further assessment of physical requirements for ultimate frisbee with appripriate recommendations for modifications and conditioning   ? Time 6   ? Period Weeks   ? Status New   ?  Target Date 02/14/22   ?  ? PT LONG TERM GOAL #3  ? Title Improve mobility with decreased palpable tightness/pain in the Rt lower quadrant   ? Time 6   ? Period Weeks   ? Status New   ? Target Date 02/14/22   ?  ? PT LONG TERM GOAL #4  ? Title Indpendent in HEP(including aquatic program as indicated)   ? Time 6   ? Period Weeks   ? Status New   ? Target Date 02/14/22   ?  ? PT LONG TERM GOAL #5  ? Title Improve functional limitation score to 82   ? Time 6   ? Period Weeks   ? Status New   ? Target Date 02/14/22   ? ?  ?  ? ?  ? ? ? ? ? ? ? ? Plan - 01/06/22 0851   ? ? Clinical Impression Statement Patient reports that she has been working on [employer] and notices that she has less pelvic pain with working out. Still has some tightness in the Rt anterior hip/groin area. Reviewed and progressed exercises. Added trial of manual work through the anterior hip and groin/adductor area Rt. Paiet would like to try manual work before deciding about DN. Added myofacial ball release work.   ? Rehab Potential Good   ? PT Frequency 2x / week   ? PT Duration 6 weeks   ? PT Treatment/Interventions ADLs/Self Care Home  Management;Aquatic Therapy;Cryotherapy;Electrical Stimulation;Iontophoresis 4mg /ml Dexamethasone;Moist Heat;Ultrasound;Therapeutic activities;Therapeutic exercise;Balance training;Neuromuscular re-education;Patient/family education;Manual techniques;Dry needling;Taping   ? PT Next Visit Plan revies and progress exercises (trial of half kneeling with diagonals); myofacial ball release work anterior hip; DN and/or manual work through hip flexors and adductors as incidated; modalities aas indicated. Will need to assess mechanics of frisbee throw as symptoms subside.   ? PT Home Exercise Plan   ? Consulted and Agree with Plan of Care Patient   ? ?  ?  ? ?  ? ? ?Patient will benefit from skilled therapeutic intervention in order to improve the following deficits and impairments:    ? ?Visit Diagnosis: ?Groin pain, right ? ?Other symptoms and signs involving the musculoskeletal system ? ?Acute pain of right shoulder ? ?Muscle weakness (generalized) ? ? ? ? ?Problem List ?Patient Active Problem List  ? Diagnosis Date Noted  ? Annual physical exam 12/20/2021  ? Chronic right hip pain 12/20/2021  ? Generalized anxiety disorder 09/13/2021  ? Skin lesion 09/04/2021  ? Chronic right shoulder pain 04/09/2021  ? Chronic right hamstring pain 04/09/2021  ? ? ?06/09/2021, PT, MPH  ?01/06/2022, 9:31 AM ? ?[facility] ?[address] ?[place], [place], [place] ?Phone: [phone]   Fax:  [phone] ? ?Name: Daylan Boggess ?MRN: Ree Shay ?Date of Birth: 1990/10/11 ? ? ? ?

## 2022-01-17 ENCOUNTER — Ambulatory Visit: Payer: No Typology Code available for payment source | Admitting: Rehabilitative and Restorative Service Providers"

## 2022-01-17 ENCOUNTER — Encounter: Payer: Self-pay | Admitting: Rehabilitative and Restorative Service Providers"

## 2022-01-17 DIAGNOSIS — R1031 Right lower quadrant pain: Secondary | ICD-10-CM

## 2022-01-17 DIAGNOSIS — M25511 Pain in right shoulder: Secondary | ICD-10-CM

## 2022-01-17 DIAGNOSIS — R29898 Other symptoms and signs involving the musculoskeletal system: Secondary | ICD-10-CM

## 2022-01-17 DIAGNOSIS — M6281 Muscle weakness (generalized): Secondary | ICD-10-CM

## 2022-01-17 DIAGNOSIS — M25551 Pain in right hip: Secondary | ICD-10-CM | POA: Diagnosis not present

## 2022-01-17 NOTE — Patient Instructions (Addendum)
Access Code: [email or web address] URL: [email or web address] Date: 01/17/2022 Prepared by: Corlis Leak  Exercises - Hip Flexor Stretch at Edge of Bed  - 2 x daily - 7 x weekly - 1 sets - 3 reps - 30 sec  hold - Seated Hip Flexor Stretch  - 2 x daily - 7 x weekly - 1 sets - 3 reps - 30 sec  hold - Prone Quadriceps Stretch with Strap  - 2 x daily - 7 x weekly - 1 sets - 3 reps - 30 sec  hold - Supine Hip Adductor Stretch  - 2 x daily - 7 x weekly - 1 sets - 3 reps - 30 sec  hold - Hip Adductors and Hamstring Stretch with Strap  - 2 x daily - 7 x weekly - 1 sets - 3 reps - 30 sec  hold - Supine Transversus Abdominis Bracing with Pelvic Floor Contraction  - 2 x daily - 7 x weekly - 1 sets - 10 reps - 10sec  hold - Hooklying Isometric Clamshell  - 2 x daily - 7 x weekly - 1-3 sets - 10 reps - 3 sec  hold - Standing Hip Abduction with Resistance at Thighs  - 1 x daily - 7 x weekly - 2-3 sets - 10 reps - 3 sec  hold - Side Stepping with Resistance at Thighs  - 1 x daily - 7 x weekly - Standing Hip Extension  - 1 x daily - 7 x weekly - 1-2 sets - 10 reps - 3 sec  hold - Sit to Stand  - 2 x daily - 7 x weekly - 1 sets - 10 reps - 3-5 sec  hold - Wall Quarter Squat  - 2 x daily - 7 x weekly - 1-2 sets - 10 reps - 5-10 sec  hold  Trigger Point Dry Needling  What is Trigger Point Dry Needling (DN)? DN is a physical therapy technique used to treat muscle pain and dysfunction. Specifically, DN helps deactivate muscle trigger points (muscle knots).  A thin filiform needle is used to penetrate the skin and stimulate the underlying trigger point. The goal is for a local twitch response (LTR) to occur and for the trigger point to relax. No medication of any kind is injected during the procedure.   What Does Trigger Point Dry Needling Feel Like?  The procedure feels different for each individual patient. Some patients report that they do not actually feel the needle enter the skin and overall the process is  not painful. Very mild bleeding may occur. However, many patients feel a deep cramping in the muscle in which the needle was inserted. This is the local twitch response.   How Will I feel after the treatment? Soreness is normal, and the onset of soreness may not occur for a few hours. Typically this soreness does not last longer than two days.  Bruising is uncommon, however; ice can be used to decrease any possible bruising.  In rare cases feeling tired or nauseous after the treatment is normal. In addition, your symptoms may get worse before they get better, this period will typically not last longer than 24 hours.   What Can I do After My Treatment? Increase your hydration by drinking more water for the next 24 hours. You may place ice or heat on the areas treated that have become sore, however, do not use heat on inflamed or bruised areas. Heat often brings more relief post needling. You can continue your regular  activities, but vigorous activity is not recommended initially after the treatment for 24 hours. DN is best combined with other physical therapy such as strengthening, stretching, and other therapies.

## 2022-01-17 NOTE — Therapy (Signed)
[facility] [place] [address] [place], [place], [address] Phone: [phone]   Fax:  [phone]  Physical Therapy Treatment Rationale for Evaluation and Treatment Rehabilitation   Patient Details  Name: Tracy Schneider MRN: [id number] Date of Birth: [birth year] Referring Provider (PT): Dr Benjamin Stain   Encounter Date: 01/17/2022   PT End of Session - 01/17/22 0806     Visit Number 3    Number of Visits 12    Date for PT Re-Evaluation 02/14/22    PT Start Time 0802    PT Stop Time 0850    PT Time Calculation (min) 48 min    Activity Tolerance Patient tolerated treatment well             History reviewed. No pertinent past medical history.  Past Surgical History:  Procedure Laterality Date   RHINOPLASTY  10/2005   septorhinoplasty    There were no vitals filed for this visit.   Subjective Assessment - 01/17/22 0807     Subjective Working on exercises at home. Seems to be improving. Some continued pain. Some decreased frequency of symptoms and intensity.    Currently in Pain? Yes    Pain Score 1     Pain Location Groin    Pain Orientation Right    Pain Descriptors / Indicators Dull                [facility] PT Assessment - 01/17/22 0001       Assessment   Medical Diagnosis Rt groin pain; Rt LBP    Referring Provider (PT) Dr Benjamin Stain    Onset Date/Surgical Date 11/13/21   symptoms present on an intermittent basis for ~ 2 years   Hand Dominance Right    Next MD Visit to schedule after PT course    Prior Therapy one visit for shoulder injury      Palpation   Palpation comment muscular tightness Rt anterior hip in psoas and hip flexors into the quads; Rt SI area                           [facility] Adult PT Treatment/Exercise - 01/17/22 0001       Self-Care   Other Self-Care Comments  myofacial ball release work prone and standing      Knee/Hip Exercises: Stretches   Hip Flexor  Stretch Right;3 reps;30 seconds    Hip Flexor Stretch Limitations supine thomas; repeated sitting    Other Knee/Hip Stretches adductor stretch supine with strap PT assist to hold LE 30 sec x 3 reps    Other Knee/Hip Stretches butterfly stretch 30 sec x 3 reps      Knee/Hip Exercises: Aerobic   Tread Mill 2.7 mph x 5 min      Knee/Hip Exercises: Standing   Hip Abduction Stengthening;Right;Left;10 reps;Knee straight    Abduction Limitations green TB    Hip Extension Stengthening;Right;Left;10 reps;Knee straight    Extension Limitations green TB    Wall Squat 10 reps    Other Standing Knee Exercises side steps  green TB 10 ft x 2 reps each side      Knee/Hip Exercises: Seated   Sit to Sand 10 reps;without UE support      Knee/Hip Exercises: Supine   Quad Sets Limitations 4 part core 10 sec hold    Other Supine Knee/Hip Exercises alternate knee drop in hooklying x 10-15 reps    Other Supine Knee/Hip Exercises alternate hip  abduction in hooklying green TB x 10 each side      Moist Heat Therapy   Number Minutes Moist Heat 10 Minutes    Moist Heat Location Hip      Manual Therapy   Manual therapy comments skilled palpation to assess response to DN and manual work    Soft tissue mobilization deep tissue work through the psoas, hip flexors, adductors Rt side    Passive ROM into hip abduction 20 sec hold x 3 reps              Trigger Point Dry Needling - 01/17/22 0001     Consent Given? Yes    Education Handout Provided Yes    Dry Needling Comments Rt    Electrical Stimulation Performed with Dry Needling Yes    E-stim with Dry Needling Details mApm current x 5 min    Adductor Response Palpable increased muscle length;Twitch response elicited                   PT Education - 01/17/22 0853     Education Details HEP, DN    Person(s) Educated Patient    Methods Explanation;Demonstration;Tactile cues;Verbal cues;Handout    Comprehension Verbalized  understanding;Returned demonstration;Verbal cues required;Tactile cues required                 PT Long Term Goals - 01/03/22 1203       PT LONG TERM GOAL #1   Title Decrease Rt groin/hip/LB pain by 75-100% allowing patient to resume normal functional activities including frisbee    Time 6    Period Weeks    Status New    Target Date 02/14/22      PT LONG TERM GOAL #2   Title Further assessment of physical requirements for ultimate frisbee with appripriate recommendations for modifications and conditioning    Time 6    Period Weeks    Status New    Target Date 02/14/22      PT LONG TERM GOAL #3   Title Improve mobility with decreased palpable tightness/pain in the Rt lower quadrant    Time 6    Period Weeks    Status New    Target Date 02/14/22      PT LONG TERM GOAL #4   Title Indpendent in HEP(including aquatic program as indicated)    Time 6    Period Weeks    Status New    Target Date 02/14/22      PT LONG TERM GOAL #5   Title Improve functional limitation score to 82    Time 6    Period Weeks    Status New    Target Date 02/14/22                   Plan - 01/17/22 8099     Clinical Impression Statement Some improvement. Not continued tightness in the Rt adductors and hip flexors. Tolerated DN and manual work well. Added strengthening exercises without difficulty.    Rehab Potential Good    PT Frequency 2x / week    PT Duration 6 weeks    PT Treatment/Interventions ADLs/Self Care Home Management;Aquatic Therapy;Cryotherapy;Electrical Stimulation;Iontophoresis 4mg /ml Dexamethasone;Moist Heat;Ultrasound;Therapeutic activities;Therapeutic exercise;Balance training;Neuromuscular re-education;Patient/family education;Manual techniques;Dry needling;Taping    PT Next Visit Plan revies and progress exercises (trial of half kneeling with diagonals); myofacial ball release work anterior hip; assess response to trial of DN and/or manual work through hip  flexors and adductors as incidated; modalities aas  indicated. Will need to assess mechanics of frisbee throw as symptoms subside.    PT Home Exercise Plan [id number]    Consulted and Agree with Plan of Care Patient             Patient will benefit from skilled therapeutic intervention in order to improve the following deficits and impairments:     Visit Diagnosis: Groin pain, right  Other symptoms and signs involving the musculoskeletal system  Acute pain of right shoulder  Muscle weakness (generalized)     Problem List Patient Active Problem List   Diagnosis Date Noted   Annual physical exam 12/20/2021   Chronic right hip pain 12/20/2021   Generalized anxiety disorder 09/13/2021   Skin lesion 09/04/2021   Chronic right shoulder pain 04/09/2021   Chronic right hamstring pain 04/09/2021    Jaecob Lowden Rober MinionP Ariane Ditullio, PT, MPH  01/17/2022, 9:09 AM  [facility] [address] [place], [place], [address] Phone: [phone]   Fax:  [phone]  Name: Tracy Schneider MRN: [id number] Date of Birth: Jun 15, 1991

## 2022-01-20 ENCOUNTER — Encounter: Payer: Self-pay | Admitting: Rehabilitative and Restorative Service Providers"

## 2022-01-20 ENCOUNTER — Ambulatory Visit: Payer: No Typology Code available for payment source | Admitting: Rehabilitative and Restorative Service Providers"

## 2022-01-20 DIAGNOSIS — R29898 Other symptoms and signs involving the musculoskeletal system: Secondary | ICD-10-CM

## 2022-01-20 DIAGNOSIS — R1031 Right lower quadrant pain: Secondary | ICD-10-CM

## 2022-01-20 DIAGNOSIS — M6281 Muscle weakness (generalized): Secondary | ICD-10-CM

## 2022-01-20 DIAGNOSIS — M25551 Pain in right hip: Secondary | ICD-10-CM | POA: Diagnosis not present

## 2022-01-20 DIAGNOSIS — M25511 Pain in right shoulder: Secondary | ICD-10-CM

## 2022-01-20 NOTE — Therapy (Addendum)
[facility] [place] [address] [place], [place], [address] Phone: [phone]   Fax:  [phone]  Physical Therapy Treatment Rationale for Evaluation and Treatment Rehabilitation  Patient Details  Name: Tracy Schneider MRN: [id number] Date of Birth: 10-29-1990 Referring Provider (PT): Dr Benjamin Stain   Encounter Date: 01/20/2022   PT End of Session - 01/20/22 0714     Visit Number 4    Number of Visits 12    Date for PT Re-Evaluation 02/14/22    PT Start Time 0714    PT Stop Time 0825    PT Time Calculation (min) 71 min    Activity Tolerance Patient tolerated treatment well             History reviewed. No pertinent past medical history.  Past Surgical History:  Procedure Laterality Date   RHINOPLASTY  10/2005   septorhinoplasty    There were no vitals filed for this visit.   Subjective Assessment - 01/20/22 0714     Subjective Played frisbee yesterday. Felt better when she was playing yesterday but had more aching and soreness after playing and this morning. Exercises are going well. Can feel some soreness in the hips but can tell the exercises are targeting the hip muscles that need to be stronger.    Currently in Pain? Yes    Pain Score 2     Pain Location Groin    Pain Orientation Right    Pain Descriptors / Indicators Dull    Pain Type Acute pain;Chronic pain                [facility] PT Assessment - 01/20/22 0001       Assessment   Medical Diagnosis Rt groin pain; Rt LBP    Referring Provider (PT) Dr Benjamin Stain    Onset Date/Surgical Date 11/13/21   symptoms present on an intermittent basis for ~ 2 years   Hand Dominance Right    Next MD Visit to schedule after PT course    Prior Therapy one visit for shoulder injury      Palpation   SI assessment  pain Rt SI with forward lumbar flexion and with Rt lateral flexion    Palpation comment muscular tightness Rt anterior hip in psoas and hip flexors  into the quads; hip adductors;Rt SI area                           [facility] Adult PT Treatment/Exercise - 01/20/22 0001       Self-Care   Other Self-Care Comments  myofacial ball release work prone and standing      Knee/Hip Exercises: Warehouse manager reps;30 seconds    Lobbyist Limitations prone with strap; foam roll distal thigh    Hip Flexor Stretch Right;3 reps;30 seconds    Hip Flexor Stretch Limitations supine thomas; repeated sitting    Other Knee/Hip Stretches adductor stretch supine with strap  - VC for neutral hip - no ER with stretch (PT assist to hold LE for 1 rep0 30 sec x 3 reps total    Other Knee/Hip Stretches sartorius stretch supine with PT assist 3 reps x 30 sec hold      Knee/Hip Exercises: Aerobic   Tread Mill 2.8 mph x 5 min      Knee/Hip Exercises: Standing   Hip Abduction Stengthening;Right;Left;10 reps;Knee straight    Abduction Limitations green TB    Hip Extension Stengthening;Right;Left;10  reps;Knee straight    Extension Limitations green TB    Wall Squat 10 reps    SLS 20-30 sec with UE movement, head turns    SLS with Vectors fwd to touch ~ 18 inch surface ~ 5 reps each side    Other Standing Knee Exercises side steps  green TB 10 ft x 2 reps each side    Other Standing Knee Exercises monster walk blue TB side steps x 15 ft x 1 each direction      Knee/Hip Exercises: Seated   Sit to Sand 10 reps;without UE support      Knee/Hip Exercises: Supine   Quad Sets Limitations 4 part core 10 sec hold x 10 reps    Other Supine Knee/Hip Exercises alternate knee drop in hooklying x 10-15 reps    Other Supine Knee/Hip Exercises alternate hip abduction in hooklying green TB x 10 each side      Moist Heat Therapy   Number Minutes Moist Heat 10 Minutes    Moist Heat Location Hip      Manual Therapy   Manual therapy comments skilled palpation to assess response to DN and manual work    Soft tissue mobilization deep tissue  work through the psoas, hip flexors, adductors Rt side    Passive ROM into hip abduction 20 sec hold x 3 reps              Trigger Point Dry Needling - 01/20/22 0001     Consent Given? Yes    Education Handout Provided Previously provided    Dry Needling Comments Rt    Electrical Stimulation Performed with Dry Needling Yes    E-stim with Dry Needling Details mApm current x 5 min    Adductor Response Palpable increased muscle length;Twitch response elicited    Rectus femoris Response Palpable increased muscle length;Twitch response elicited    Iliopsoas Response Twitch response elicited                   PT Education - 01/20/22 0803     Education Details HEP    Person(s) Educated Patient    Methods Explanation;Demonstration;Tactile cues;Verbal cues;Handout    Comprehension Verbalized understanding;Returned demonstration;Verbal cues required;Tactile cues required                 PT Long Term Goals - 01/03/22 1203       PT LONG TERM GOAL #1   Title Decrease Rt groin/hip/LB pain by 75-100% allowing patient to resume normal functional activities including frisbee    Time 6    Period Weeks    Status New    Target Date 02/14/22      PT LONG TERM GOAL #2   Title Further assessment of physical requirements for ultimate frisbee with appripriate recommendations for modifications and conditioning    Time 6    Period Weeks    Status New    Target Date 02/14/22      PT LONG TERM GOAL #3   Title Improve mobility with decreased palpable tightness/pain in the Rt lower quadrant    Time 6    Period Weeks    Status New    Target Date 02/14/22      PT LONG TERM GOAL #4   Title Indpendent in HEP(including aquatic program as indicated)    Time 6    Period Weeks    Status New    Target Date 02/14/22      PT LONG TERM GOAL #  5   Title Improve functional limitation score to 82    Time 6    Period Weeks    Status New    Target Date 02/14/22                    Plan - 01/20/22 0742     Clinical Impression Statement Gradual improvement in muscular tightness. Continued tightness noted through the Rt hip adductors and flexors. Strengthening exercises going well. Added SLS today and increased TB resistance for side steps; added antirotation for core stabilization. Will progress with strengthening and stabilization, add glut/hip extensor strengtheing.    Rehab Potential Good    PT Frequency 2x / week    PT Duration 6 weeks    PT Treatment/Interventions ADLs/Self Care Home Management;Aquatic Therapy;Cryotherapy;Electrical Stimulation;Iontophoresis 4mg /ml Dexamethasone;Moist Heat;Ultrasound;Therapeutic activities;Therapeutic exercise;Balance training;Neuromuscular re-education;Patient/family education;Manual techniques;Dry needling;Taping    PT Next Visit Plan revies and progress exercises (trial of half kneeling with diagonals); myofacial ball release work anterior hip; assess response to trial of DN and/or manual work through hip flexors and adductors as incidated; modalities as indicated. Will need to assess mechanics of frisbee throw as symptoms subside.    PT Home Exercise Plan [id number]    Consulted and Agree with Plan of Care Patient             Patient will benefit from skilled therapeutic intervention in order to improve the following deficits and impairments:     Visit Diagnosis: Groin pain, right  Other symptoms and signs involving the musculoskeletal system  Acute pain of right shoulder  Muscle weakness (generalized)     Problem List Patient Active Problem List   Diagnosis Date Noted   Annual physical exam 12/20/2021   Chronic right hip pain 12/20/2021   Generalized anxiety disorder 09/13/2021   Skin lesion 09/04/2021   Chronic right shoulder pain 04/09/2021   Chronic right hamstring pain 04/09/2021    Wess Baney Rober MinionP Luca Burston, PT, MPH  01/20/2022, 8:38 AM  [facility] [address]  [place] [address] [place], [place], [address] Phone: [phone]   Fax:  [phone]  Name: Ree Shayatalie Beber MRN: [id number] Date of Birth: Apr 02, 1991

## 2022-01-20 NOTE — Patient Instructions (Signed)
Access Code: [email or web address] URL: [email or web address] Date: 01/20/2022 Prepared by: Corlis Leak  Exercises - Hip Flexor Stretch at [facility] of Bed  - 2 x daily - 7 x weekly - 1 sets - 3 reps - 30 sec  hold - Seated Hip Flexor Stretch  - 2 x daily - 7 x weekly - 1 sets - 3 reps - 30 sec  hold - Prone Quadriceps Stretch with Strap  - 2 x daily - 7 x weekly - 1 sets - 3 reps - 30 sec  hold - Supine Hip Adductor Stretch  - 2 x daily - 7 x weekly - 1 sets - 3 reps - 30 sec  hold - Hip Adductors and Hamstring Stretch with Strap  - 2 x daily - 7 x weekly - 1 sets - 3 reps - 30 sec  hold - Supine Transversus Abdominis Bracing with Pelvic Floor Contraction  - 2 x daily - 7 x weekly - 1 sets - 10 reps - 10sec  hold - Hooklying Isometric Clamshell  - 2 x daily - 7 x weekly - 1-3 sets - 10 reps - 3 sec  hold - Standing Hip Abduction with Resistance at Thighs  - 1 x daily - 7 x weekly - 2-3 sets - 10 reps - 3 sec  hold - Side Stepping with Resistance at Thighs  - 1 x daily - 7 x weekly - Standing Hip Extension  - 1 x daily - 7 x weekly - 1-2 sets - 10 reps - 3 sec  hold - Sit to Stand  - 2 x daily - 7 x weekly - 1 sets - 10 reps - 3-5 sec  hold - Wall Quarter Squat  - 2 x daily - 7 x weekly - 1-2 sets - 10 reps - 5-10 sec  hold - Single Leg Stance  - 2 x daily - 7 x weekly - 2 sets - 5 reps - 20 sec hold - The Diver  - 2 x daily - 7 x weekly - 1 sets - 10 reps - 2-3 sec  hold - Anti-Rotation Lateral Stepping with Press  - 2 x daily - 7 x weekly - 1-2 sets - 10 reps - 2-3 sec  hold

## 2022-01-24 ENCOUNTER — Ambulatory Visit: Payer: No Typology Code available for payment source | Admitting: Physical Therapy

## 2022-01-24 DIAGNOSIS — M6281 Muscle weakness (generalized): Secondary | ICD-10-CM

## 2022-01-24 DIAGNOSIS — R29898 Other symptoms and signs involving the musculoskeletal system: Secondary | ICD-10-CM

## 2022-01-24 DIAGNOSIS — M25551 Pain in right hip: Secondary | ICD-10-CM | POA: Diagnosis not present

## 2022-01-24 DIAGNOSIS — M25511 Pain in right shoulder: Secondary | ICD-10-CM

## 2022-01-24 DIAGNOSIS — R1031 Right lower quadrant pain: Secondary | ICD-10-CM

## 2022-01-24 NOTE — Therapy (Signed)
[facility] [address] [place], [place], [address] Phone: [phone]   Fax:  [phone]  Physical Therapy Treatment  Patient Details  Name: Tracy Schneider MRN: [id number] Date of Birth: 1991-01-21 Referring Provider (PT): Dr Benjamin Stainhekkekandam   Encounter Date: 01/24/2022   PT End of Session - 01/24/22 0938     Visit Number 5    Number of Visits 12    Date for PT Re-Evaluation 02/14/22    PT Start Time 0937    PT Stop Time 1015    PT Time Calculation (min) 38 min    Activity Tolerance Patient tolerated treatment well    Behavior [facility] Therapy [facility] for tasks assessed/performed             No past medical history on file.  Past Surgical History:  Procedure Laterality Date   RHINOPLASTY  10/2005   septorhinoplasty    There were no vitals filed for this visit.   Subjective Assessment - 01/24/22 0938     Subjective Pt states groin has been feeling pretty good; however, this past week she strained her R hamstring around Sunday. Pt tried to do 2 back to back days. Feels it sore.    Pertinent History history of some intermittent LBP and hip    Diagnostic tests x-ray-1. Normal appearance of the bilateral femoroacetabular joints.  2. There is a normal variant right-sided lumbosacral transitional  assimilation joint with mild associated degenerative change. This  can be associated with right lower back pain and/or right hip pain.    Patient Stated Goals get rid of the pain and return to sports    Currently in Pain? Yes    Pain Score 2     Pain Location Groin    Pain Orientation Right    Multiple Pain Sites Yes                [facility] PT Assessment - 01/24/22 0001       Assessment   Medical Diagnosis Rt groin pain; Rt LBP    Referring Provider (PT) Dr Benjamin Stainhekkekandam    Onset Date/Surgical Date 11/13/21    Hand Dominance Right    Next MD Visit to schedule after PT course    Prior Therapy one visit for shoulder  injury                           [facility] - 01/24/22 0001       Knee/Hip Exercises: Stretches   Active Hamstring Stretch Right;30 seconds    Active Hamstring Stretch Limitations knee slightly bent    Quad Stretch Right;2 reps;30 seconds    Quad Stretch Limitations prone with strap; foam roll distal thigh    Hip Flexor Stretch Right;3 reps;30 seconds    Hip Flexor Stretch Limitations supine thomas; repeated sitting    Other Knee/Hip Stretches adductor stretch supine with strap  - VC for neutral hip - no ER with stretch (PT assist to hold LE for 1 rep0 30 sec x 3 reps total      Knee/Hip Exercises: Aerobic   Tread Mill 2.8 mph x 5 min      Knee/Hip Exercises: Standing   Other Standing Knee Exercises anti rotation with side stepping green tband 2x10      Knee/Hip Exercises: Sidelying   Clams 2x10 green tband    Other Sidelying Knee/Hip Exercises heel tap forward and back 2x10  Knee/Hip Exercises: Prone   Hamstring Curl 2 sets;10 reps    Hip Extension Strengthening;2 sets;10 reps    Hip Extension Limitations knee bent    Other Prone Exercises Reverse clam green tband 2x10                          PT Long Term Goals - 01/03/22 1203       PT LONG TERM GOAL #1   Title Decrease Rt groin/hip/LB pain by 75-100% allowing patient to resume normal functional activities including frisbee    Time 6    Period Weeks    Status New    Target Date 02/14/22      PT LONG TERM GOAL #2   Title Further assessment of physical requirements for ultimate frisbee with appripriate recommendations for modifications and conditioning    Time 6    Period Weeks    Status New    Target Date 02/14/22      PT LONG TERM GOAL #3   Title Improve mobility with decreased palpable tightness/pain in the Rt lower quadrant    Time 6    Period Weeks    Status New    Target Date 02/14/22      PT LONG TERM GOAL #4   Title Indpendent in  HEP(including aquatic program as indicated)    Time 6    Period Weeks    Status New    Target Date 02/14/22      PT LONG TERM GOAL #5   Title Improve functional limitation score to 82    Time 6    Period Weeks    Status New    Target Date 02/14/22                   Plan - 01/24/22 1001     Clinical Impression Statement Treatment focused on continuing strengthening without adding to hamstring strain. Continued to work on stabilization.    Rehab Potential Good    PT Frequency 2x / week    PT Duration 6 weeks    PT Treatment/Interventions ADLs/Self Care Home Management;Aquatic Therapy;Cryotherapy;Electrical Stimulation;Iontophoresis 4mg /ml Dexamethasone;Moist Heat;Ultrasound;Therapeutic activities;Therapeutic exercise;Balance training;Neuromuscular re-education;Patient/family education;Manual techniques;Dry needling;Taping    PT Next Visit Plan revies and progress exercises (trial of half kneeling with diagonals); myofacial ball release work anterior hip; assess response to trial of DN and/or manual work through hip flexors and adductors as incidated; modalities as indicated. Will need to assess mechanics of frisbee throw as symptoms subside.    PT Home Exercise Plan    Consulted and Agree with Plan of Care Patient             Patient will benefit from skilled therapeutic intervention in order to improve the following deficits and impairments:     Visit Diagnosis: Groin pain, right  Other symptoms and signs involving the musculoskeletal system  Acute pain of right shoulder  Muscle weakness (generalized)     Problem List Patient Active Problem List   Diagnosis Date Noted   Annual physical exam 12/20/2021   Chronic right hip pain 12/20/2021   Generalized anxiety disorder 09/13/2021   Skin lesion 09/04/2021   Chronic right shoulder pain 04/09/2021   Chronic right hamstring pain 04/09/2021    [facility] April May, PT, DPT 01/24/2022, 10:20  AM  [place] [address] [place], [place], [place] Phone: [phone]   Fax:  [phone]  Name:  Tracy Schneider MRN: [id number] Date of Birth: 08-07-1991

## 2022-01-30 ENCOUNTER — Ambulatory Visit
Payer: No Typology Code available for payment source | Attending: Sports Medicine | Admitting: Rehabilitative and Restorative Service Providers"

## 2022-01-30 ENCOUNTER — Encounter: Payer: Self-pay | Admitting: Rehabilitative and Restorative Service Providers"

## 2022-01-30 DIAGNOSIS — R29898 Other symptoms and signs involving the musculoskeletal system: Secondary | ICD-10-CM | POA: Diagnosis present

## 2022-01-30 DIAGNOSIS — M25511 Pain in right shoulder: Secondary | ICD-10-CM | POA: Diagnosis present

## 2022-01-30 DIAGNOSIS — M6281 Muscle weakness (generalized): Secondary | ICD-10-CM | POA: Diagnosis present

## 2022-01-30 DIAGNOSIS — R1031 Right lower quadrant pain: Secondary | ICD-10-CM | POA: Diagnosis present

## 2022-01-30 NOTE — Patient Instructions (Signed)
Access Code: [email or web address] URL: [email or web address] Date: 01/30/2022 Prepared by: Corlis Leak  Exercises - Hip Flexor Stretch at Edge of Bed  - 2 x daily - 7 x weekly - 1 sets - 3 reps - 30 sec  hold - Seated Hip Flexor Stretch  - 2 x daily - 7 x weekly - 1 sets - 3 reps - 30 sec  hold - Prone Quadriceps Stretch with Strap  - 2 x daily - 7 x weekly - 1 sets - 3 reps - 30 sec  hold - Supine Hip Adductor Stretch  - 2 x daily - 7 x weekly - 1 sets - 3 reps - 30 sec  hold - Hip Adductors and Hamstring Stretch with Strap  - 2 x daily - 7 x weekly - 1 sets - 3 reps - 30 sec  hold - Supine Transversus Abdominis Bracing with Pelvic Floor Contraction  - 2 x daily - 7 x weekly - 1 sets - 10 reps - 10sec  hold - Hooklying Isometric Clamshell  - 2 x daily - 7 x weekly - 1-3 sets - 10 reps - 3 sec  hold - Standing Hip Abduction with Resistance at Thighs  - 1 x daily - 7 x weekly - 2-3 sets - 10 reps - 3 sec  hold - Side Stepping with Resistance at Thighs  - 1 x daily - 7 x weekly - Standing Hip Extension  - 1 x daily - 7 x weekly - 1-2 sets - 10 reps - 3 sec  hold - Sit to Stand  - 2 x daily - 7 x weekly - 1 sets - 10 reps - 3-5 sec  hold - Wall Quarter Squat  - 2 x daily - 7 x weekly - 1-2 sets - 10 reps - 5-10 sec  hold - Single Leg Stance  - 2 x daily - 7 x weekly - 2 sets - 5 reps - 20 sec hold - The Diver  - 2 x daily - 7 x weekly - 1 sets - 10 reps - 2-3 sec  hold - Anti-Rotation Lateral Stepping with Press  - 2 x daily - 7 x weekly - 1-2 sets - 10 reps - 2-3 sec  hold - Prone Hip Extension with Bent Knee  - 1 x daily - 7 x weekly - 2-3 sets - 10 reps - Sidelying Hip Abduction  - 1 x daily - 7 x weekly - 2-3 sets - 10 reps - Clamshell with Resistance  - 1 x daily - 7 x weekly - 3 sets - 10 reps - Prone Alternating Bent Leg Fallout  - 1 x daily - 7 x weekly - 3 sets - 10 reps - Hooklying Hamstring Stretch with Strap  - 2 x daily - 7 x weekly - 1 sets - 3 reps - 30 sec  hold

## 2022-01-30 NOTE — Therapy (Signed)
[facility] [address] [place], [place], [address] Phone: [phone]   Fax:  [phone]  Physical Therapy Treatment Rationale for Evaluation and Treatment Rehabilitation  Patient Details  Name: Tracy Schneider MRN: [id number] Date of Birth: Nov 28, 1990 Referring Provider (PT): Dr Benjamin Stainhekkekandam   Encounter Date: 01/30/2022   PT End of Session - 01/30/22 0932     Visit Number 6    Number of Visits 12    Date for PT Re-Evaluation 02/14/22    PT Start Time 0930    PT Stop Time 1019    PT Time Calculation (min) 49 min    Activity Tolerance Patient tolerated treatment well             History reviewed. No pertinent past medical history.  Past Surgical History:  Procedure Laterality Date   RHINOPLASTY  10/2005   septorhinoplasty    There were no vitals filed for this visit.   Subjective Assessment - 01/30/22 0932     Subjective Patient reports that she has not been running in the past week. The Rt hamstring area is still irritated. The groin is not too bad. "Held up pretty well".    Currently in Pain? Yes    Pain Score 1     Pain Location Groin   hamstring area dull 3/10   Pain Orientation Right    Pain Descriptors / Indicators Dull    Pain Type Acute pain;Chronic pain                               [facility] Adult PT Treatment/Exercise - 01/30/22 0001       Knee/Hip Exercises: Stretches   Passive Hamstring Stretch Right;3 reps;30 seconds    Passive Hamstring Stretch Limitations supine with strap    Hip Flexor Stretch Right;3 reps;30 seconds    Hip Flexor Stretch Limitations supine thomas; repeated sitting    Other Knee/Hip Stretches adductor stretch supine with strap  - VC for neutral hip - no ER with stretch (PT assist to hold LE for 1 rep0 30 sec x 3 reps total    Other Knee/Hip Stretches sartorius stretch supine with PT assist 3 reps x 30 sec hold      Knee/Hip Exercises: Aerobic    Tread Mill 2.8 mph x 5 min      Knee/Hip Exercises: Supine   Quad Sets Limitations 4 part core 10 sec hold x 10 reps      Moist Heat Therapy   Number Minutes Moist Heat 10 Minutes    Moist Heat Location Hip      Manual Therapy   Manual therapy comments skilled palpation to assess response to DN and manual work    Soft tissue mobilization deep tissue work through the hamstrings, psoas, hip flexors, adductors Rt side    Passive ROM into hip abduction 20 sec hold x 3 reps    Other Manual Therapy IASTM medial hamstrings    Kinesiotex Inhibit Muscle      Kinesiotix   Inhibit Muscle  i strip medial HS ~ 60-70% stretch              Trigger Point Dry Needling - 01/30/22 0001     Consent Given? Yes    Education Handout Provided Previously provided    Dry Needling Comments Rt    Electrical Stimulation Performed with Dry Needling Yes    E-stim with Dry Needling Details mApm  current x 5 min    Adductor Response Palpable increased muscle length;Twitch response elicited    Hamstring Response Palpable increased muscle length;Twitch response elicited                   PT Education - 01/30/22 1012     Education Details HEP    Person(s) Educated Patient    Methods Explanation;Demonstration;Tactile cues;Verbal cues;Handout    Comprehension Verbalized understanding;Returned demonstration;Verbal cues required;Tactile cues required                 PT Long Term Goals - 01/03/22 1203       PT LONG TERM GOAL #1   Title Decrease Rt groin/hip/LB pain by 75-100% allowing patient to resume normal functional activities including frisbee    Time 6    Period Weeks    Status New    Target Date 02/14/22      PT LONG TERM GOAL #2   Title Further assessment of physical requirements for ultimate frisbee with appripriate recommendations for modifications and conditioning    Time 6    Period Weeks    Status New    Target Date 02/14/22      PT LONG TERM GOAL #3   Title Improve  mobility with decreased palpable tightness/pain in the Rt lower quadrant    Time 6    Period Weeks    Status New    Target Date 02/14/22      PT LONG TERM GOAL #4   Title Indpendent in HEP(including aquatic program as indicated)    Time 6    Period Weeks    Status New    Target Date 02/14/22      PT LONG TERM GOAL #5   Title Improve functional limitation score to 82    Time 6    Period Weeks    Status New    Target Date 02/14/22                   Plan - 01/30/22 0934     Clinical Impression Statement Continued flare up of Rt hamstrings. Groin pain is less overall but still noticable. Reviewed exercises and continued stabilization. DN and manual work hamstrings and adductors. Less palpable tightness noted in psoas. Will continue with HEP and assess response to DN/manual work and trial taping medial HS    Rehab Potential Good    PT Frequency 2x / week    PT Duration 6 weeks    PT Treatment/Interventions ADLs/Self Care Home Management;Aquatic Therapy;Cryotherapy;Electrical Stimulation;Iontophoresis 4mg /ml Dexamethasone;Moist Heat;Ultrasound;Therapeutic activities;Therapeutic exercise;Balance training;Neuromuscular re-education;Patient/family education;Manual techniques;Dry needling;Taping    PT Next Visit Plan review and progress exercises (trial of half kneeling with diagonals); myofacial ball release work anterior hip; continue DN and/or manual work through hip flexors and adductors as incidated; modalities as indicated. Will need to assess mechanics of frisbee throw as symptoms subside.    PT Home Exercise Plan    Consulted and Agree with Plan of Care Patient             Patient will benefit from skilled therapeutic intervention in order to improve the following deficits and impairments:     Visit Diagnosis: Groin pain, right  Other symptoms and signs involving the musculoskeletal system  Acute pain of right shoulder  Muscle weakness  (generalized)     Problem List Patient Active Problem List   Diagnosis Date Noted   Annual physical exam 12/20/2021   Chronic right hip pain 12/20/2021  Generalized anxiety disorder 09/13/2021   Skin lesion 09/04/2021   Chronic right shoulder pain 04/09/2021   Chronic right hamstring pain 04/09/2021    Riannah Stagner Rober Minion, PT, MPH  01/30/2022, 10:42 AM  [facility] [address] [place], [place], [address] Phone: [phone]   Fax:  [phone]  Name: Tracy Schneider MRN: [id number] Date of Birth: [birth year]

## 2022-02-07 ENCOUNTER — Encounter: Payer: No Typology Code available for payment source | Admitting: Physical Therapy

## 2022-02-10 ENCOUNTER — Encounter: Payer: Self-pay | Admitting: Rehabilitative and Restorative Service Providers"

## 2022-02-10 ENCOUNTER — Ambulatory Visit: Payer: No Typology Code available for payment source | Admitting: Rehabilitative and Restorative Service Providers"

## 2022-02-10 DIAGNOSIS — R29898 Other symptoms and signs involving the musculoskeletal system: Secondary | ICD-10-CM

## 2022-02-10 DIAGNOSIS — R1031 Right lower quadrant pain: Secondary | ICD-10-CM | POA: Diagnosis not present

## 2022-02-10 DIAGNOSIS — M25511 Pain in right shoulder: Secondary | ICD-10-CM

## 2022-02-10 DIAGNOSIS — M6281 Muscle weakness (generalized): Secondary | ICD-10-CM

## 2022-02-10 NOTE — Therapy (Addendum)
[facility] [address] [place], [place], [address] Phone: [phone]   Fax:  [phone]  Physical Therapy Treatment Rationale for Evaluation and Treatment Rehabilitation  Patient Details  Name: Tracy Schneider MRN: [id number] Date of Birth: 1991/07/20 Referring Provider (PT): Dr Benjamin Stainhekkekandam   Encounter Date: 02/10/2022   PT End of Session - 02/10/22 0719     Visit Number 7    Number of Visits 12    Date for PT Re-Evaluation 02/14/22    PT Start Time 0717    PT Stop Time 0805    PT Time Calculation (min) 48 min    Activity Tolerance Patient tolerated treatment well             History reviewed. No pertinent past medical history.  Past Surgical History:  Procedure Laterality Date   RHINOPLASTY  10/2005   septorhinoplasty    There were no vitals filed for this visit.   Subjective Assessment - 02/10/22 0720     Subjective Patient reports that she she played in a tournament and did OK - she noted some soreness in hamstrings but not full blown flare up. Held exercises a couple of days and then started with stretching and some strengthening again.    Currently in Pain? Yes    Pain Score 1     Pain Location Groin    Pain Orientation Right    Pain Descriptors / Indicators Dull;Sore    Pain Type Acute pain;Chronic pain                [facility] PT Assessment - 02/10/22 0001       Assessment   Medical Diagnosis Rt groin pain; Rt LBP    Referring Provider (PT) Dr Benjamin Stainhekkekandam    Onset Date/Surgical Date 11/13/21    Hand Dominance Right    Next MD Visit to schedule after PT course    Prior Therapy one visit for shoulder injury      Palpation   Palpation comment muscular tightness Rt anterior hip in psoas and hip flexors into the quads; hip adductors;Rt SI area                           [facility]Ex[facility] - 02/10/22 0001       Knee/Hip Exercises: Stretches   Science writerQuad  Stretch Right;3 reps;30 seconds    Quad Stretch Limitations half kneeling frw and diagonal    Hip Flexor Stretch Right;3 reps;30 seconds    Hip Flexor Stretch Limitations supine thomas; repeated sitting    Other Knee/Hip Stretches adductor stretch supine with strap  - VC for neutral hip - no ER with stretch (PT assist to hold LE for 1 rep0 30 sec x 3 reps total    Other Knee/Hip Stretches sartorius stretch supine with PT assist 3 reps x 30 sec hold      Knee/Hip Exercises: Aerobic   Tread Mill 2.8 mph x 6 min      Knee/Hip Exercises: Standing   Lateral Step Up Both;10 reps;Hand Hold: 1;Step Height: 6"    Forward Step Up Both;10 reps;Hand Hold: 1;Step Height: 6"    Step Down Both;10 reps;Hand Hold: 1;Step Height: 6"      Manual Therapy   Manual therapy comments skilled palpation to assess response to DN and manual work    Soft tissue mobilization deep tissue work through the hamstrings, psoas, hip flexors, adductors Rt side  Passive ROM into hip abduction 20 sec hold x 3 reps    Other Manual Therapy IASTM medial hamstrings    Kinesiotex Inhibit Muscle              Trigger Point Dry Needling - 02/10/22 0001     Consent Given? Yes    Education Handout Provided Previously provided    Dry Needling Comments Rt    Electrical Stimulation Performed with Dry Needling Yes    E-stim with Dry Needling Details mApm current x 5 min    Adductor Response Palpable increased muscle length;Twitch response elicited    Rectus femoris Response Palpable increased muscle length;Twitch response elicited                   PT Education - 02/10/22 0753     Education Details HEP    Person(s) Educated Patient    Methods Explanation;Demonstration;Tactile cues;Verbal cues;Handout    Comprehension Verbalized understanding;Returned demonstration;Verbal cues required;Tactile cues required                 PT Long Term Goals - 01/03/22 1203       PT LONG TERM GOAL #1   Title Decrease  Rt groin/hip/LB pain by 75-100% allowing patient to resume normal functional activities including frisbee    Time 6    Period Weeks    Status New    Target Date 02/14/22      PT LONG TERM GOAL #2   Title Further assessment of physical requirements for ultimate frisbee with appripriate recommendations for modifications and conditioning    Time 6    Period Weeks    Status New    Target Date 02/14/22      PT LONG TERM GOAL #3   Title Improve mobility with decreased palpable tightness/pain in the Rt lower quadrant    Time 6    Period Weeks    Status New    Target Date 02/14/22      PT LONG TERM GOAL #4   Title Indpendent in HEP(including aquatic program as indicated)    Time 6    Period Weeks    Status New    Target Date 02/14/22      PT LONG TERM GOAL #5   Title Improve functional limitation score to 82    Time 6    Period Weeks    Status New    Target Date 02/14/22                   Plan - 02/10/22 6269     Clinical Impression Statement Improving exercise tolerance - played in a frisbee tournament without significant flare up - just soreness. Has progressed resistive exercises and continues with stretching. Good response to DN and manual work. Gradually progressing toward stated goals of therapy which is expected due to long standing nature of injury/irritation.    Rehab Potential Good    PT Frequency 2x / week    PT Duration 6 weeks    PT Treatment/Interventions ADLs/Self Care Home Management;Aquatic Therapy;Cryotherapy;Electrical Stimulation;Iontophoresis 4mg /ml Dexamethasone;Moist Heat;Ultrasound;Therapeutic activities;Therapeutic exercise;Balance training;Neuromuscular re-education;Patient/family education;Manual techniques;Dry needling;Taping    PT Next Visit Plan review and progress exercises (trial of half kneeling with diagonals); myofacial ball release work anterior hip; continue DN and/or manual work through hip flexors and adductors as incidated;  modalities as indicated. Will need to assess mechanics of frisbee throw as symptoms subside.    PT Home Exercise Plan    Consulted and Agree  with Plan of Care Patient             Patient will benefit from skilled therapeutic intervention in order to improve the following deficits and impairments:     Visit Diagnosis: Groin pain, right  Other symptoms and signs involving the musculoskeletal system  Acute pain of right shoulder  Muscle weakness (generalized)     Problem List Patient Active Problem List   Diagnosis Date Noted   Annual physical exam 12/20/2021   Chronic right hip pain 12/20/2021   Generalized anxiety disorder 09/13/2021   Skin lesion 09/04/2021   Chronic right shoulder pain 04/09/2021   Chronic right hamstring pain 04/09/2021    Daelen Belvedere Rober Minion, PT, MPH  02/10/2022, 11:35 AM  [facility] [address] [place], [place], [address] Phone: [phone]   Fax:  [phone]  Name: Tracy Schneider MRN: [id number] Date of Birth: February 13, 1991

## 2022-02-10 NOTE — Patient Instructions (Signed)
Access Code: [email or web address] URL: [email or web address] Date: 02/10/2022 Prepared by: Corlis Leak  Exercises - Hip Flexor Stretch at Edge of Bed  - 2 x daily - 7 x weekly - 1 sets - 3 reps - 30 sec  hold - Seated Hip Flexor Stretch  - 2 x daily - 7 x weekly - 1 sets - 3 reps - 30 sec  hold - Prone Quadriceps Stretch with Strap  - 2 x daily - 7 x weekly - 1 sets - 3 reps - 30 sec  hold - Supine Hip Adductor Stretch  - 2 x daily - 7 x weekly - 1 sets - 3 reps - 30 sec  hold - Hip Adductors and Hamstring Stretch with Strap  - 2 x daily - 7 x weekly - 1 sets - 3 reps - 30 sec  hold - Supine Transversus Abdominis Bracing with Pelvic Floor Contraction  - 2 x daily - 7 x weekly - 1 sets - 10 reps - 10sec  hold - Hooklying Isometric Clamshell  - 2 x daily - 7 x weekly - 1-3 sets - 10 reps - 3 sec  hold - Standing Hip Abduction with Resistance at Thighs  - 1 x daily - 7 x weekly - 2-3 sets - 10 reps - 3 sec  hold - Side Stepping with Resistance at Thighs  - 1 x daily - 7 x weekly - Standing Hip Extension  - 1 x daily - 7 x weekly - 1-2 sets - 10 reps - 3 sec  hold - Sit to Stand  - 2 x daily - 7 x weekly - 1 sets - 10 reps - 3-5 sec  hold - Wall Quarter Squat  - 2 x daily - 7 x weekly - 1-2 sets - 10 reps - 5-10 sec  hold - Single Leg Stance  - 2 x daily - 7 x weekly - 2 sets - 5 reps - 20 sec hold - The Diver  - 2 x daily - 7 x weekly - 1 sets - 10 reps - 2-3 sec  hold - Anti-Rotation Lateral Stepping with Press  - 2 x daily - 7 x weekly - 1-2 sets - 10 reps - 2-3 sec  hold - Prone Hip Extension with Bent Knee  - 1 x daily - 7 x weekly - 2-3 sets - 10 reps - Sidelying Hip Abduction  - 1 x daily - 7 x weekly - 2-3 sets - 10 reps - Clamshell with Resistance  - 1 x daily - 7 x weekly - 3 sets - 10 reps - Prone Alternating Bent Leg Fallout  - 1 x daily - 7 x weekly - 3 sets - 10 reps - Hooklying Hamstring Stretch with Strap  - 2 x daily - 7 x weekly - 1 sets - 3 reps - 30 sec  hold - Half  Kneeling Hip Flexor Stretch  - 2 x daily - 7 x weekly - 1 sets - 3 reps - 30 sec  hold - Step Up  - 2 x daily - 7 x weekly - 1 sets - 10 reps - 2 sec  hold - Lateral Step Up  - 2 x daily - 7 x weekly - 2 sets - 10 reps - 2 sec  hold

## 2022-02-13 ENCOUNTER — Ambulatory Visit: Payer: No Typology Code available for payment source | Admitting: Rehabilitative and Restorative Service Providers"

## 2022-02-13 ENCOUNTER — Encounter: Payer: Self-pay | Admitting: Rehabilitative and Restorative Service Providers"

## 2022-02-13 DIAGNOSIS — R29898 Other symptoms and signs involving the musculoskeletal system: Secondary | ICD-10-CM

## 2022-02-13 DIAGNOSIS — M25511 Pain in right shoulder: Secondary | ICD-10-CM

## 2022-02-13 DIAGNOSIS — R1031 Right lower quadrant pain: Secondary | ICD-10-CM

## 2022-02-13 DIAGNOSIS — M6281 Muscle weakness (generalized): Secondary | ICD-10-CM

## 2022-02-13 NOTE — Therapy (Signed)
[facility] [place] [address] [place] [address] [place], [place], [address] Phone: [phone]   Fax:  [phone]  Physical Therapy Treatment Rationale for Evaluation and Treatment Rehabilitation  Patient Details  Name: Tracy Schneider MRN: [id number] Date of Birth: [birth year] Referring Provider (PT): Dr Benjamin Stain   Encounter Date: 02/13/2022   PT End of Session - 02/13/22 1549     Visit Number 8    Number of Visits 12    Date for PT Re-Evaluation 02/14/22    PT Start Time 1515    PT Stop Time 1604    PT Time Calculation (min) 49 min    Activity Tolerance Patient tolerated treatment well             History reviewed. No pertinent past medical history.  Past Surgical History:  Procedure Laterality Date   RHINOPLASTY  10/2005   septorhinoplasty    There were no vitals filed for this visit.   Subjective Assessment - 02/13/22 1549     Subjective Patient reports that she was doing well until the past few days. She walked for 2.5 miles over the weekend on up and down hills and noticed that Rt hamstring and then went to practice yesterday and hamstrings were doing OK. She was doing well initially in practice and then had increased intensity of anterior hip/groin pain    Currently in Pain? No/denies    Pain Score 0-No pain   no groin pain - discomfort and tigtness in the Rt HS   Pain Orientation Right                               [facility] Adult PT Treatment/Exercise - 02/13/22 0001       Knee/Hip Exercises: Stretches   Passive Hamstring Stretch Right;3 reps;30 seconds    Passive Hamstring Stretch Limitations supine with strap - straight and adducted and abducted positions    Other Knee/Hip Stretches adductor stretch supine with strap  - VC for neutral hip - no ER with stretch (PT assist to hold LE for 1 rep 30 sec x 3 reps total      Knee/Hip Exercises: Aerobic   Tread Mill 2.8 mph x 8 min      Manual Therapy    Manual therapy comments skilled palpation to assess response to DN and manual work    Soft tissue mobilization deep tissue work through the hamstrings, psoas, hip flexors, adductors Rt side              Trigger Point Dry Needling - 02/13/22 0001     Consent Given? Yes    Education Handout Provided Previously provided    Dry Needling Comments Rt    Electrical Stimulation Performed with Dry Needling Yes    E-stim with Dry Needling Details mApm current x 5 min    Hamstring Response Palpable increased muscle length;Twitch response elicited                        PT Long Term Goals - 01/03/22 1203       PT LONG TERM GOAL #1   Title Decrease Rt groin/hip/LB pain by 75-100% allowing patient to resume normal functional activities including frisbee    Time 6    Period Weeks    Status New    Target Date 02/14/22      PT LONG TERM GOAL #2   Title Further assessment  of physical requirements for ultimate frisbee with appripriate recommendations for modifications and conditioning    Time 6    Period Weeks    Status New    Target Date 02/14/22      PT LONG TERM GOAL #3   Title Improve mobility with decreased palpable tightness/pain in the Rt lower quadrant    Time 6    Period Weeks    Status New    Target Date 02/14/22      PT LONG TERM GOAL #4   Title Indpendent in HEP(including aquatic program as indicated)    Time 6    Period Weeks    Status New    Target Date 02/14/22      PT LONG TERM GOAL #5   Title Improve functional limitation score to 82    Time 6    Period Weeks    Status New    Target Date 02/14/22                   Plan - 02/13/22 1711     Clinical Impression Statement Flare up of hamstring tightness and pain in the Rt LE and to a lesser extent some tightness in the groin. Was able to practice and warm up with frisbee team yesterday but began to notice increased pain as she increased intensity with practice. Discussed history of  musculoskeletal injury - patient reports that she has had ongoing injuries in hamstrings/groin area over the past 5 or more years during the frisbee tournament season as well as pelvic floor dysfunction including pain and intermittent incontinence with jumping or higher impact activities. Discussed the activity going forward and the possibility of referral to pelvic floor specialist to address any ongoing pelvic floor issues.    Rehab Potential Good    PT Frequency 2x / week    PT Duration 6 weeks    PT Treatment/Interventions ADLs/Self Care Home Management;Aquatic Therapy;Cryotherapy;Electrical Stimulation;Iontophoresis 4mg /ml Dexamethasone;Moist Heat;Ultrasound;Therapeutic activities;Therapeutic exercise;Balance training;Neuromuscular re-education;Patient/family education;Manual techniques;Dry needling;Taping    PT Next Visit Plan review and progress exercises (trial of half kneeling with diagonals); myofacial ball release work anterior hip; continue DN and/or manual work through hip flexors and adductors as incidated; modalities as indicated. Will need to assess mechanics of frisbee throw as symptoms subside.    PT Home Exercise Plan    Consulted and Agree with Plan of Care Patient             Patient will benefit from skilled therapeutic intervention in order to improve the following deficits and impairments:     Visit Diagnosis: Groin pain, right  Other symptoms and signs involving the musculoskeletal system  Acute pain of right shoulder  Muscle weakness (generalized)     Problem List Patient Active Problem List   Diagnosis Date Noted   Annual physical exam 12/20/2021   Chronic right hip pain 12/20/2021   Generalized anxiety disorder 09/13/2021   Skin lesion 09/04/2021   Chronic right shoulder pain 04/09/2021   Chronic right hamstring pain 04/09/2021    Tracy Schneider 06/09/2021, PT, MPH  02/13/2022, 5:20 PM  [facility] [address]  [place] [address] [place], [place], [place] Phone: [phone]   Fax:  [phone]  Name: Tracy Schneider MRN: Ree Shay Date of Birth: 1991-06-14

## 2022-02-17 ENCOUNTER — Ambulatory Visit: Payer: No Typology Code available for payment source | Admitting: Rehabilitative and Restorative Service Providers"

## 2022-02-17 ENCOUNTER — Encounter: Payer: Self-pay | Admitting: Rehabilitative and Restorative Service Providers"

## 2022-02-17 ENCOUNTER — Encounter: Payer: Self-pay | Admitting: Sports Medicine

## 2022-02-17 DIAGNOSIS — M6281 Muscle weakness (generalized): Secondary | ICD-10-CM

## 2022-02-17 DIAGNOSIS — R29898 Other symptoms and signs involving the musculoskeletal system: Secondary | ICD-10-CM

## 2022-02-17 DIAGNOSIS — R1031 Right lower quadrant pain: Secondary | ICD-10-CM

## 2022-02-17 DIAGNOSIS — M25511 Pain in right shoulder: Secondary | ICD-10-CM

## 2022-02-17 DIAGNOSIS — G8929 Other chronic pain: Secondary | ICD-10-CM

## 2022-02-17 NOTE — Patient Instructions (Signed)
Access Code: [email or web address] URL: [email or web address] Date: 02/17/2022 Prepared by: Corlis Leak  Exercises - Hip Flexor Stretch at Edge of Bed  - 2 x daily - 7 x weekly - 1 sets - 3 reps - 30 sec  hold - Seated Hip Flexor Stretch  - 2 x daily - 7 x weekly - 1 sets - 3 reps - 30 sec  hold - Prone Quadriceps Stretch with Strap  - 2 x daily - 7 x weekly - 1 sets - 3 reps - 30 sec  hold - Supine Hip Adductor Stretch  - 2 x daily - 7 x weekly - 1 sets - 3 reps - 30 sec  hold - Hip Adductors and Hamstring Stretch with Strap  - 2 x daily - 7 x weekly - 1 sets - 3 reps - 30 sec  hold - Supine Transversus Abdominis Bracing with Pelvic Floor Contraction  - 2 x daily - 7 x weekly - 1 sets - 10 reps - 10sec  hold - Hooklying Isometric Clamshell  - 2 x daily - 7 x weekly - 1-3 sets - 10 reps - 3 sec  hold - Standing Hip Abduction with Resistance at Thighs  - 1 x daily - 7 x weekly - 2-3 sets - 10 reps - 3 sec  hold - Side Stepping with Resistance at Thighs  - 1 x daily - 7 x weekly - Standing Hip Extension  - 1 x daily - 7 x weekly - 1-2 sets - 10 reps - 3 sec  hold - Sit to Stand  - 2 x daily - 7 x weekly - 1 sets - 10 reps - 3-5 sec  hold - Wall Quarter Squat  - 2 x daily - 7 x weekly - 1-2 sets - 10 reps - 5-10 sec  hold - Single Leg Stance  - 2 x daily - 7 x weekly - 2 sets - 5 reps - 20 sec hold - The Diver  - 2 x daily - 7 x weekly - 1 sets - 10 reps - 2-3 sec  hold - Anti-Rotation Lateral Stepping with Press  - 2 x daily - 7 x weekly - 1-2 sets - 10 reps - 2-3 sec  hold - Prone Hip Extension with Bent Knee  - 1 x daily - 7 x weekly - 2-3 sets - 10 reps - Sidelying Hip Abduction  - 1 x daily - 7 x weekly - 2-3 sets - 10 reps - Clamshell with Resistance  - 1 x daily - 7 x weekly - 3 sets - 10 reps - Prone Alternating Bent Leg Fallout  - 1 x daily - 7 x weekly - 3 sets - 10 reps - Hooklying Hamstring Stretch with Strap  - 2 x daily - 7 x weekly - 1 sets - 3 reps - 30 sec  hold - Half  Kneeling Hip Flexor Stretch  - 2 x daily - 7 x weekly - 1 sets - 3 reps - 30 sec  hold - Step Up  - 2 x daily - 7 x weekly - 1 sets - 10 reps - 2 sec  hold - Lateral Step Up  - 2 x daily - 7 x weekly - 2 sets - 10 reps - 2 sec  hold - Trail Leg Lunge  - 1 x daily - 7 x weekly - 1-3 sets - 10 reps - 3-5 sec  hold - Lunges with Pelvic Floor Contractions  - 1 x  daily - 7 x weekly - 1-3 sets - 10 reps - 3 sec  hold - Goblet Squat with Kettlebell  - 1 x daily - 7 x weekly - 1 sets - 10 reps - 3 sec  hold

## 2022-02-17 NOTE — Therapy (Addendum)
[facility] [place] [address] [place] [address] [place], [place], [address] Phone: [phone]   Fax:  [phone]  Physical Therapy Treatment Rationale for Evaluation and Treatment Rehabilitation  Patient Details  Name: Tracy Schneider MRN: [id number] Date of Birth: 12/22/1990 Referring Provider (PT): Dr Benjamin Stain   Encounter Date: 02/17/2022   PT End of Session - 02/17/22 0809     Visit Number 9    Number of Visits 12    Date for PT Re-Evaluation 02/14/22    PT Start Time 0800    PT Stop Time 0848    PT Time Calculation (min) 48 min    Activity Tolerance Patient tolerated treatment well             History reviewed. No pertinent past medical history.  Past Surgical History:  Procedure Laterality Date   RHINOPLASTY  10/2005   septorhinoplasty    There were no vitals filed for this visit.   Subjective Assessment - 02/17/22 0810     Subjective Patient reports that she had more groin pain than HS pain. She is back to the aching type pain.    Currently in Pain? Yes    Pain Score 3     Pain Location Groin    Pain Orientation Right    Pain Descriptors / Indicators Dull;Sore    Pain Type Acute pain;Chronic pain                [facility] PT Assessment - 02/17/22 0001       Assessment   Medical Diagnosis Rt groin pain; Rt LBP    Referring Provider (PT) Dr Benjamin Stain    Onset Date/Surgical Date 11/13/21    Hand Dominance Right    Next MD Visit to schedule after PT course    Prior Therapy one visit for shoulder injury      Palpation   Palpation comment tightness and tenderness medial groin area and muscular tightness Rt anterior hip in psoas and hip flexors into the quads; hip adductors;Rt SI area                           [facility] Adult PT Treatment/Exercise - 02/17/22 0001       Knee/Hip Exercises: Stretches   Passive Hamstring Stretch Right;3 reps;30 seconds    Passive Hamstring Stretch Limitations  supine with strap - straight and adducted and abducted positions    Lobbyist Right;3 reps;30 seconds    Quad Stretch Limitations half kneeling frw and diagonal    Hip Flexor Stretch Right;3 reps;30 seconds    Hip Flexor Stretch Limitations supine thomas; repeated sitting    Other Knee/Hip Stretches adductor stretch supine with strap  - VC for neutral hip - no ER with stretch (PT assist to hold LE for 1 rep 30 sec x 3 reps total      Knee/Hip Exercises: Aerobic   Tread Mill 2.8 mph x 10 min      Knee/Hip Exercises: Standing   Other Standing Knee Exercises rear foot elevated split squat x 10 bilat; lunge x 10 bilat; goblet squat 10# KB x 10 reps x 2 circuits      Manual Therapy   Manual therapy comments skilled palpation to assess response to DN and manual work    Soft tissue mobilization deep tissue work through the hamstrings, psoas, hip flexors, adductors Rt side              Trigger [employer]  Dry Needling - 02/17/22 0001     Consent Given? Yes    Education Handout Provided Previously provided    Dry Needling Comments Rt    Electrical Stimulation Performed with Dry Needling Yes    E-stim with Dry Needling Details mApm current x 5 min    Adductor Response Palpable increased muscle length;Twitch response elicited    Rectus femoris Response Palpable increased muscle length;Twitch response elicited                   PT Education - 02/17/22 0851     Education Details HEP    Person(s) Educated Patient    Methods Explanation;Demonstration;Tactile cues;Verbal cues;Handout    Comprehension Verbalized understanding;Returned demonstration;Verbal cues required;Tactile cues required                 PT Long Term Goals - 01/03/22 1203       PT LONG TERM GOAL #1   Title Decrease Rt groin/hip/LB pain by 75-100% allowing patient to resume normal functional activities including frisbee    Time 6    Period Weeks    Status New    Target Date 02/14/22      PT LONG  TERM GOAL #2   Title Further assessment of physical requirements for ultimate frisbee with appripriate recommendations for modifications and conditioning    Time 6    Period Weeks    Status New    Target Date 02/14/22      PT LONG TERM GOAL #3   Title Improve mobility with decreased palpable tightness/pain in the Rt lower quadrant    Time 6    Period Weeks    Status New    Target Date 02/14/22      PT LONG TERM GOAL #4   Title Indpendent in HEP(including aquatic program as indicated)    Time 6    Period Weeks    Status New    Target Date 02/14/22      PT LONG TERM GOAL #5   Title Improve functional limitation score to 82    Time 6    Period Weeks    Status New    Target Date 02/14/22                   Plan - 02/17/22 0826     Clinical Impression Statement Hamstrings are less pain and tightness but now back to aching groin pain. Note tightness in the medial groin area. Good response to DN and manual work. Progressed with strengthening exercises.    Rehab Potential Good    PT Frequency 2x / week    PT Duration 6 weeks    PT Treatment/Interventions ADLs/Self Care Home Management;Aquatic Therapy;Cryotherapy;Electrical Stimulation;Iontophoresis 4mg /ml Dexamethasone;Moist Heat;Ultrasound;Therapeutic activities;Therapeutic exercise;Balance training;Neuromuscular re-education;Patient/family education;Manual techniques;Dry needling;Taping    PT Next Visit Plan review and progress exercises (trial of half kneeling with diagonals); myofacial ball release work anterior hip; continue DN and/or manual work through hip flexors and adductors as incidated; modalities as indicated. Will need to assess mechanics of frisbee throw as symptoms subside.    PT Home Exercise Plan    Consulted and Agree with Plan of Care Patient             Patient will benefit from skilled therapeutic intervention in order to improve the following deficits and impairments:     Visit  Diagnosis: Groin pain, right  Other symptoms and signs involving the musculoskeletal system  Acute pain of right shoulder  Muscle weakness (generalized)     Problem List Patient Active Problem List   Diagnosis Date Noted   Annual physical exam 12/20/2021   Chronic right hip pain 12/20/2021   Generalized anxiety disorder 09/13/2021   Skin lesion 09/04/2021   Chronic right shoulder pain 04/09/2021   Chronic right hamstring pain 04/09/2021    Ladarion Munyon Rober Minion, PT, MPH  02/17/2022, 4:27 PM  [facility] [address] [place] [address] [place], [place], [address] Phone: [phone]   Fax:  [phone]  Name: Asha Grumbine MRN: [id number] Date of Birth: 04/25/1991

## 2022-02-18 LAB — COMPREHENSIVE METABOLIC PANEL
AG Ratio: 1.8 (calc) (ref 1.0–2.5)
ALT: 29 U/L (ref 6–29)
AST: 19 U/L (ref 10–30)
Albumin: 4.3 g/dL (ref 3.6–5.1)
Alkaline phosphatase (APISO): 31 U/L (ref 31–125)
BUN: 9 mg/dL (ref 7–25)
CO2: 24 mmol/L (ref 20–32)
Calcium: 8.8 mg/dL (ref 8.6–10.2)
Chloride: 108 mmol/L (ref 98–110)
Creat: 0.71 mg/dL (ref 0.50–0.96)
Globulin: 2.4 g/dL (calc) (ref 1.9–3.7)
Glucose, Bld: 84 mg/dL (ref 65–99)
Potassium: 4.8 mmol/L (ref 3.5–5.3)
Sodium: 140 mmol/L (ref 135–146)
Total Bilirubin: 0.3 mg/dL (ref 0.2–1.2)
Total Protein: 6.7 g/dL (ref 6.1–8.1)

## 2022-02-18 LAB — LIPID PANEL
Cholesterol: 226 mg/dL — ABNORMAL HIGH (ref <200)
HDL: 52 mg/dL (ref >=50)
LDL Cholesterol (Calc): 150 mg/dL (calc) — ABNORMAL HIGH
Non-HDL Cholesterol (Calc): 174 mg/dL (calc) — ABNORMAL HIGH (ref <130)
Total CHOL/HDL Ratio: 4.3 (calc) (ref <5.0)
Triglycerides: 121 mg/dL (ref <150)

## 2022-02-18 LAB — CBC
HCT: 37.9 % (ref 35.0–45.0)
Hemoglobin: 12.5 g/dL (ref 11.7–15.5)
MCH: 30.1 pg (ref 27.0–33.0)
MCHC: 33 g/dL (ref 32.0–36.0)
MCV: 91.3 fL (ref 80.0–100.0)
MPV: 11.2 fL (ref 7.5–12.5)
Platelets: 177 10*3/uL (ref 140–400)
RBC: 4.15 10*6/uL (ref 3.80–5.10)
RDW: 13 % (ref 11.0–15.0)
WBC: 4.4 10*3/uL (ref 3.8–10.8)

## 2022-02-18 LAB — TSH: TSH: 2.33 mIU/L

## 2022-02-25 ENCOUNTER — Ambulatory Visit: Payer: No Typology Code available for payment source | Admitting: Rehabilitative and Restorative Service Providers"

## 2022-02-25 ENCOUNTER — Encounter: Payer: Self-pay | Admitting: Rehabilitative and Restorative Service Providers"

## 2022-02-25 DIAGNOSIS — M6281 Muscle weakness (generalized): Secondary | ICD-10-CM

## 2022-02-25 DIAGNOSIS — R29898 Other symptoms and signs involving the musculoskeletal system: Secondary | ICD-10-CM

## 2022-02-25 DIAGNOSIS — R1031 Right lower quadrant pain: Secondary | ICD-10-CM

## 2022-02-25 DIAGNOSIS — M25511 Pain in right shoulder: Secondary | ICD-10-CM

## 2022-03-01 LAB — HM PAP SMEAR: HM Pap smear: NORMAL

## 2022-03-01 LAB — RESULTS CONSOLE HPV: CHL HPV: NEGATIVE

## 2022-03-20 ENCOUNTER — Ambulatory Visit
Payer: No Typology Code available for payment source | Attending: Sports Medicine | Admitting: Rehabilitative and Restorative Service Providers"

## 2022-03-20 ENCOUNTER — Encounter: Payer: Self-pay | Admitting: Rehabilitative and Restorative Service Providers"

## 2022-03-20 DIAGNOSIS — M6281 Muscle weakness (generalized): Secondary | ICD-10-CM | POA: Insufficient documentation

## 2022-03-20 DIAGNOSIS — R1031 Right lower quadrant pain: Secondary | ICD-10-CM | POA: Diagnosis not present

## 2022-03-20 DIAGNOSIS — R29898 Other symptoms and signs involving the musculoskeletal system: Secondary | ICD-10-CM | POA: Insufficient documentation

## 2022-03-20 DIAGNOSIS — M25511 Pain in right shoulder: Secondary | ICD-10-CM | POA: Insufficient documentation

## 2022-03-20 NOTE — Patient Instructions (Signed)
Access Code: [email or web address] URL: [email or web address] Date: 03/20/2022 Prepared by: Corlis Leak  Exercises - Hip Flexor Stretch at [facility] of Bed  - 2 x daily - 7 x weekly - 1 sets - 3 reps - 30 sec  hold - Seated Hip Flexor Stretch  - 2 x daily - 7 x weekly - 1 sets - 3 reps - 30 sec  hold - Prone Quadriceps Stretch with Strap  - 2 x daily - 7 x weekly - 1 sets - 3 reps - 30 sec  hold - Supine Hip Adductor Stretch  - 2 x daily - 7 x weekly - 1 sets - 3 reps - 30 sec  hold - Hip Adductors and Hamstring Stretch with Strap  - 2 x daily - 7 x weekly - 1 sets - 3 reps - 30 sec  hold - Supine Transversus Abdominis Bracing with Pelvic Floor Contraction  - 2 x daily - 7 x weekly - 1 sets - 10 reps - 10sec  hold - Hooklying Isometric Clamshell  - 2 x daily - 7 x weekly - 1-3 sets - 10 reps - 3 sec  hold - Standing Hip Abduction with Resistance at Thighs  - 1 x daily - 7 x weekly - 2-3 sets - 10 reps - 3 sec  hold - Side Stepping with Resistance at Thighs  - 1 x daily - 7 x weekly - Standing Hip Extension  - 1 x daily - 7 x weekly - 1-2 sets - 10 reps - 3 sec  hold - Sit to Stand  - 2 x daily - 7 x weekly - 1 sets - 10 reps - 3-5 sec  hold - Wall Quarter Squat  - 2 x daily - 7 x weekly - 1-2 sets - 10 reps - 5-10 sec  hold - Single Leg Stance  - 2 x daily - 7 x weekly - 2 sets - 5 reps - 20 sec hold - The Diver  - 2 x daily - 7 x weekly - 1 sets - 10 reps - 2-3 sec  hold - Anti-Rotation Lateral Stepping with Press  - 2 x daily - 7 x weekly - 1-2 sets - 10 reps - 2-3 sec  hold - Prone Hip Extension with Bent Knee  - 1 x daily - 7 x weekly - 2-3 sets - 10 reps - Sidelying Hip Abduction  - 1 x daily - 7 x weekly - 2-3 sets - 10 reps - Clamshell with Resistance  - 1 x daily - 7 x weekly - 3 sets - 10 reps - Prone Alternating Bent Leg Fallout  - 1 x daily - 7 x weekly - 3 sets - 10 reps - Hooklying Hamstring Stretch with Strap  - 2 x daily - 7 x weekly - 1 sets - 3 reps - 30 sec  hold - Half  Kneeling Hip Flexor Stretch  - 2 x daily - 7 x weekly - 1 sets - 3 reps - 30 sec  hold - Step Up  - 2 x daily - 7 x weekly - 1 sets - 10 reps - 2 sec  hold - Lateral Step Up  - 2 x daily - 7 x weekly - 2 sets - 10 reps - 2 sec  hold - Trail Leg Lunge  - 1 x daily - 7 x weekly - 1-3 sets - 10 reps - 3-5 sec  hold - Lunges with Pelvic Floor Contractions  - 1 x  daily - 7 x weekly - 1-3 sets - 10 reps - 3 sec  hold - Goblet Squat with Kettlebell  - 1 x daily - 7 x weekly - 1 sets - 10 reps - 3 sec  hold - Anti-Rotation Lateral Stepping with Press  - 2 x daily - 7 x weekly - 1-2 sets - 10 reps - 2-3 sec  hold - Plank on Counter  - 2 x daily - 7 x weekly - 1 sets - 3 reps - 30 sec  hold - Modified Deadlift with Pelvic Contraction  - 1 x daily - 7 x weekly - 1 sets - 10 reps

## 2022-03-20 NOTE — Therapy (Signed)
[facility] [place] [address] [place], [place], [address] Phone: [phone]   Fax:  [phone]  Physical Therapy Treatment  Rationale for Evaluation and Treatment Rehabilitation  Patient Details  Name: Tracy Schneider MRN: [id number] Date of Birth: 08-11-1991 Referring Provider (PT): Dr Benjamin Stain   Encounter Date: 03/20/2022   PT End of Session - 03/20/22 0803     Visit Number 11    Number of Visits 22    Date for PT Re-Evaluation 04/08/22    PT Start Time 0800    PT Stop Time 0850    PT Time Calculation (min) 50 min    Activity Tolerance Patient tolerated treatment well             History reviewed. No pertinent past medical history.  Past Surgical History:  Procedure Laterality Date   RHINOPLASTY  10/2005   septorhinoplasty    There were no vitals filed for this visit.   Subjective Assessment - 03/20/22 0803     Subjective On vacation for the past coupe of weeks - walking 8-10 miles per day and climbe [facility] and did pretty well. Some soreness and aching but not big flare ups and did not have to avoid any of the activities she had planned for vacation.    Currently in Pain? Yes    Pain Score 4     Pain Location Groin    Pain Orientation Right    Pain Descriptors / Indicators Sore;Dull    Pain Type Acute pain;Chronic pain    Pain Onset More than a month ago    Pain Frequency Intermittent                [facility] PT Assessment - 03/20/22 0001       Assessment   Medical Diagnosis Rt groin pain; Rt LBP    Referring Provider (PT) Dr Benjamin Stain    Onset Date/Surgical Date 11/13/21    Hand Dominance Right    Next MD Visit to schedule after PT course    Prior Therapy one visit for shoulder injury      AROM   Lumbar Flexion 90% tight HS    Lumbar Extension 85% no pain    Lumbar - Right Side Bend 100%    Lumbar - Left Side Bend 100% pull on Rt LB    Lumbar - Right Rotation 70%    Lumbar - Left  Rotation 70%      Palpation   Palpation comment tightness and tenderness medial groin area in pectinus and muscular tightness Rt anterior hip in psoas and hip flexors into the quads; hip adductors;Rt SI area                           [facility] Adult PT Treatment/Exercise - 03/20/22 0001       Self-Care   Lifting modified deadlift 20 # x 10  from ~ 8 inch surface hinged hip core engaged      Knee/Hip Exercises: Aerobic   Tread Mill 2.8 mph x 7 min      Knee/Hip Exercises: Standing   Other Standing Knee Exercises rear foot elevated split squat x 10 bilat; lunge x 10 bilat; goblet squat 10# KB x 10 reps x 2 circuits    Other Standing Knee Exercises anti rotation with side stepping blue tband 2x10      Moist Heat Therapy   Number Minutes Moist Heat 10 Minutes  Moist Heat Location Hip      Manual Therapy   Manual therapy comments skilled palpation to assess response to DN and manual work    Soft tissue mobilization deep tissue work through the hamstrings, psoas, hip flexors, adductors Rt side    Other Manual Therapy IASTM medial hamstrings                     PT Education - 03/20/22 0823     Education Details HEP    Person(s) Educated Patient    Methods Explanation;Demonstration;Tactile cues;Verbal cues;Handout    Comprehension Verbalized understanding;Returned demonstration;Verbal cues required;Tactile cues required                 PT Long Term Goals - 02/25/22 0845       PT LONG TERM GOAL #1   Title Decrease Rt groin/hip/LB pain by 75-100% allowing patient to resume normal functional activities including frisbee    Time 6    Period Weeks    Status On-going    Target Date 04/08/22      PT LONG TERM GOAL #2   Title Further assessment of physical requirements for ultimate frisbee with appripriate recommendations for modifications and conditioning    Time 6    Period Weeks    Status On-going    Target Date 04/08/22      PT LONG TERM  GOAL #3   Title Improve mobility with decreased palpable tightness/pain in the Rt lower quadrant    Time 6    Period Weeks    Status On-going    Target Date 04/08/22      PT LONG TERM GOAL #4   Title Indpendent in HEP(including aquatic program as indicated)    Time 6    Period Weeks    Status On-going    Target Date 04/08/22      PT LONG TERM GOAL #5   Title Improve functional limitation score to 82    Time 6    Period Weeks    Status On-going    Target Date 04/08/22                   Plan - 03/20/22 0807     Clinical Impression Statement Patient did well with increased activity while on vacation. She walked 8-10 miles/day and had some expected soreness but no major flare up. She has continued sorenes in theRt groin and has had some increased soreness but not pain. Note continued tightness in theRt anterior hip through the iliacus, quads(rectus femorius), pectineus, adductors. Good response to DN and manual work. Added core stabilization exercises.    Rehab Potential Good    PT Duration 6 weeks    PT Treatment/Interventions ADLs/Self Care Home Management;Aquatic Therapy;Cryotherapy;Electrical Stimulation;Iontophoresis 4mg /ml Dexamethasone;Moist Heat;Ultrasound;Therapeutic activities;Therapeutic exercise;Balance training;Neuromuscular re-education;Patient/family education;Manual techniques;Dry needling;Taping    PT Next Visit Plan review and progress exercises; myofacial ball release work anterior hip; continue DN and/or manual work through hip flexors and adductors as incidated; modalities as indicated; further assess mechanics of frisbee throw as symptoms subside.    PT Home Exercise Plan    Consulted and Agree with Plan of Care Patient             Patient will benefit from skilled therapeutic intervention in order to improve the following deficits and impairments:     Visit Diagnosis: Groin pain, right  Other symptoms and signs involving the  musculoskeletal system  Acute pain of right shoulder  Muscle  weakness (generalized)     Problem List Patient Active Problem List   Diagnosis Date Noted   Annual physical exam 12/20/2021   Chronic right hip pain 12/20/2021   Generalized anxiety disorder 09/13/2021   Skin lesion 09/04/2021   Chronic right shoulder pain 04/09/2021   Chronic right hamstring pain 04/09/2021    Lynnleigh Soden Rober Minion, PT, MPH  03/20/2022, 8:51 AM  [facility] [address] [place], [place], [address] Phone: [phone]   Fax:  [phone]  Name: Tracy Schneider MRN: [id number] Date of Birth: 1991-06-21

## 2022-03-23 ENCOUNTER — Encounter (INDEPENDENT_AMBULATORY_CARE_PROVIDER_SITE_OTHER): Payer: No Typology Code available for payment source | Admitting: Sports Medicine

## 2022-03-23 DIAGNOSIS — S76311A Strain of muscle, fascia and tendon of the posterior muscle group at thigh level, right thigh, initial encounter: Secondary | ICD-10-CM | POA: Diagnosis not present

## 2022-03-24 NOTE — Telephone Encounter (Signed)
I spent 5 total minutes of online digital evaluation and management services in this patient-initiated request for online care. 

## 2022-03-25 ENCOUNTER — Ambulatory Visit: Payer: No Typology Code available for payment source | Admitting: Rehabilitative and Restorative Service Providers"

## 2022-03-27 ENCOUNTER — Other Ambulatory Visit (HOSPITAL_COMMUNITY): Payer: Self-pay

## 2022-04-03 ENCOUNTER — Encounter: Payer: Self-pay | Admitting: Rehabilitative and Restorative Service Providers"

## 2022-04-03 ENCOUNTER — Ambulatory Visit
Payer: No Typology Code available for payment source | Attending: Sports Medicine | Admitting: Rehabilitative and Restorative Service Providers"

## 2022-04-03 DIAGNOSIS — M25511 Pain in right shoulder: Secondary | ICD-10-CM | POA: Insufficient documentation

## 2022-04-03 DIAGNOSIS — R1031 Right lower quadrant pain: Secondary | ICD-10-CM | POA: Diagnosis present

## 2022-04-03 DIAGNOSIS — R29898 Other symptoms and signs involving the musculoskeletal system: Secondary | ICD-10-CM | POA: Insufficient documentation

## 2022-04-03 DIAGNOSIS — M6281 Muscle weakness (generalized): Secondary | ICD-10-CM | POA: Insufficient documentation

## 2022-04-03 NOTE — Therapy (Signed)
[facility] [place] [address] [place], [place], [address] Phone: [phone]   Fax:  [phone]  Physical Therapy Treatment Rationale for Evaluation and Treatment Rehabilitation  Patient Details  Name: Tracy Schneider MRN: [id number] Date of Birth: [birth year] Referring Provider (PT): Dr Benjamin Stain   Encounter Date: 04/03/2022   PT End of Session - 04/03/22 0806     Visit Number 12    Number of Visits 22    Date for PT Re-Evaluation 04/08/22    PT Start Time 0804    PT Stop Time 0855    PT Time Calculation (min) 51 min    Activity Tolerance Patient tolerated treatment well             History reviewed. No pertinent past medical history.  Past Surgical History:  Procedure Laterality Date   RHINOPLASTY  10/2005   septorhinoplasty    There were no vitals filed for this visit.   Subjective Assessment - 04/03/22 0807     Subjective Feels like she has hit a stand still. Continues to have some discomfort in the anterior Rt hip at about a 2/10. Did travel again this past weekend and has not been able to do her strengthening exercises like she wants to. She does some stretching but overall less time for exercises. Sometimes notices that she is walking with no pain.    Currently in Pain? Yes    Pain Score 2     Pain Location Groin    Pain Orientation Right    Pain Descriptors / Indicators Sore;Dull    Pain Type Acute pain;Chronic pain    Pain Radiating Towards Rt anterior groin, hip    Pain Onset More than a month ago    Pain Frequency Intermittent                               [facility] Adult PT Treatment/Exercise - 04/03/22 0001       Knee/Hip Exercises: Stretches   Passive Hamstring Stretch Limitations supine with strap - straight and adducted and abducted positions    Lobbyist Right;3 reps;30 seconds    Quad Stretch Limitations half kneeling frw and diagonal    Hip Flexor Stretch Right;3  reps;30 seconds    Hip Flexor Stretch Limitations supine thomas; repeated sitting    Other Knee/Hip Stretches adductor stretch supine with strap  - VC for neutral hip - no ER with stretch (PT assist to hold LE for 1 rep 30 sec x 3 reps total      Knee/Hip Exercises: Aerobic   Tread Mill 2.8 mph x 7 min; backwards walking 1.5 mph x 5 min working on hip extension facilitating posterior hip and stretching hip flexors      Knee/Hip Exercises: Standing   Other Standing Knee Exercises rear foot elevated split squat x 10 bilat; lunge x 10 bilat; goblet squat 10# KB x 10 reps x 2 circuits    Other Standing Knee Exercises anti rotation with side stepping blue tband 2x10      Moist Heat Therapy   Number Minutes Moist Heat 10 Minutes    Moist Heat Location Hip      Manual Therapy   Manual therapy comments skilled palpation to assess response to DN and manual work    Soft tissue mobilization deep tissue work through the hamstrings, psoas, hip flexors, adductors Rt side    Other Manual Therapy IASTM medial hamstrings  Trigger Point Dry Needling - 04/03/22 0001     Consent Given? Yes    Education Handout Provided Previously provided    Dry Needling Comments Rt    Electrical Stimulation Performed with Dry Needling Yes    E-stim with Dry Needling Details mApm current x 5 min    Adductor Response Palpable increased muscle length;Twitch response elicited    Rectus femoris Response Palpable increased muscle length;Twitch response elicited    Iliopsoas Response Palpable increased muscle length;Twitch response elicited                        PT Long Term Goals - 02/25/22 0845       PT LONG TERM GOAL #1   Title Decrease Rt groin/hip/LB pain by 75-100% allowing patient to resume normal functional activities including frisbee    Time 6    Period Weeks    Status On-going    Target Date 04/08/22      PT LONG TERM GOAL #2   Title Further assessment of physical  requirements for ultimate frisbee with appripriate recommendations for modifications and conditioning    Time 6    Period Weeks    Status On-going    Target Date 04/08/22      PT LONG TERM GOAL #3   Title Improve mobility with decreased palpable tightness/pain in the Rt lower quadrant    Time 6    Period Weeks    Status On-going    Target Date 04/08/22      PT LONG TERM GOAL #4   Title Indpendent in HEP(including aquatic program as indicated)    Time 6    Period Weeks    Status On-going    Target Date 04/08/22      PT LONG TERM GOAL #5   Title Improve functional limitation score to 82    Time 6    Period Weeks    Status On-going    Target Date 04/08/22                   Plan - 04/03/22 0810     Clinical Impression Statement Patient reports some changes in functional activities and she is walking at times without pain. Trial of backwards walking on the treadmill with some stretch in the anterior hip. Continued with DN and manual work. Patient will be able to increase her exercises in the next couple of weeks. Note continued deep tightness in the anterior hip in pectineus, hip adductors and hip flexors. Gradual improvement and progress which is anticipated due to the longstanding and recurrent problems.    Rehab Potential Good    PT Frequency 2x / week    PT Duration 6 weeks    PT Treatment/Interventions ADLs/Self Care Home Management;Aquatic Therapy;Cryotherapy;Electrical Stimulation;Iontophoresis 4mg /ml Dexamethasone;Moist Heat;Ultrasound;Therapeutic activities;Therapeutic exercise;Balance training;Neuromuscular re-education;Patient/family education;Manual techniques;Dry needling;Taping    PT Next Visit Plan review and progress exercises; myofacial ball release work anterior hip; continue DN and/or manual work through hip flexors and adductors as incidated; modalities as indicated; further assess mechanics of frisbee throw as symptoms subside.    PT Home Exercise Plan              Patient will benefit from skilled therapeutic intervention in order to improve the following deficits and impairments:     Visit Diagnosis: Groin pain, right  Other symptoms and signs involving the musculoskeletal system  Acute pain of right shoulder  Muscle weakness (generalized)  Problem List Patient Active Problem List   Diagnosis Date Noted   Annual physical exam 12/20/2021   Chronic right hip pain 12/20/2021   Generalized anxiety disorder 09/13/2021   Skin lesion 09/04/2021   Chronic right shoulder pain 04/09/2021   Chronic right hamstring pain 04/09/2021    Sanah Kraska Rober Minion, PT, MPH  04/03/2022, 8:30 AM  [facility] [address] [place], [place], [address] Phone: [phone]   Fax:  [phone]  Name: Joyelle Siedlecki MRN: [id number] Date of Birth: December 16, 1990

## 2022-04-08 ENCOUNTER — Encounter: Payer: Self-pay | Admitting: Rehabilitative and Restorative Service Providers"

## 2022-04-08 ENCOUNTER — Ambulatory Visit: Payer: No Typology Code available for payment source | Admitting: Rehabilitative and Restorative Service Providers"

## 2022-04-08 DIAGNOSIS — R1031 Right lower quadrant pain: Secondary | ICD-10-CM

## 2022-04-08 DIAGNOSIS — M6281 Muscle weakness (generalized): Secondary | ICD-10-CM

## 2022-04-08 DIAGNOSIS — M25511 Pain in right shoulder: Secondary | ICD-10-CM

## 2022-04-08 DIAGNOSIS — R29898 Other symptoms and signs involving the musculoskeletal system: Secondary | ICD-10-CM

## 2022-04-08 NOTE — Addendum Note (Signed)
Addended by: Val Riles on: 04/08/2022 11:09 AM   Modules accepted: Orders

## 2022-04-08 NOTE — Therapy (Addendum)
[place], [place], [address] Phone: [phone]   Fax:  [phone]  Physical Therapy Treatment and Discharge Summary   PHYSICAL THERAPY DISCHARGE SUMMARY  Visits from Start of Care: 13  Current functional level related to goals / functional outcomes: See progress note for discharge status   Remaining deficits: Continued pain with certain activities. Will continue evaluation and treatment with pelvic Pt    Education / Equipment: HEP    Patient agrees to discharge. Patient goals were partially met. Patient is being discharged due to  therapy to be continued with pelvic floor PT.  Rationale for Evaluation and Treatment Rehabilitation  Patient Details  Name: Tracy Schneider MRN: [id number] Date of Birth: [birth year] Referring Provider (PT): Dr Dianah Field   Encounter Date: 04/08/2022   PT End of Session - 04/08/22 0805     Visit Number 13    Number of Visits 24    Date for PT Re-Evaluation 05/20/22    PT Start Time 0802    PT Stop Time 0850    PT Time Calculation (min) 48 min    Activity Tolerance Patient tolerated treatment well             History reviewed. No pertinent past medical history.  Past Surgical History:  Procedure Laterality Date   RHINOPLASTY  10/2005   septorhinoplasty    There were no vitals filed for this visit.   Subjective Assessment - 04/08/22 0806     Subjective Can tell she is making progress - having no in tense pain and more times when she has little to no pain. Overall she is tolerating more activities without the usual flare up of pain. Tryiing to avoid sleeping in prone figure 4 positions but will sometimes go back to that position. She will call to check on pelvic PT appointment.    Currently in Pain? Yes    Pain Score 2     Pain Location Groin    Pain Orientation Right    Pain Descriptors / Indicators Sore;Dull;Aching    Pain Type Acute  pain;Chronic pain    Pain Onset More than a month ago    Pain Frequency Intermittent    Aggravating Factors  cutting, running, sitting prolonged periods of time, freesbie    Pain Relieving Factors rest lying down any position; stretching                [facility] PT Assessment - 04/08/22 0001       Assessment   Medical Diagnosis Rt groin pain; Rt LBP    Referring Provider (PT) Dr Dianah Field    Onset Date/Surgical Date 11/13/21    Hand Dominance Right    Next MD Visit to schedule after PT course    Prior Therapy one visit for shoulder injury      AROM   Lumbar Flexion 90% tight HS    Lumbar Extension 85% no pain    Lumbar - Right Side Bend 100%    Lumbar - Left Side Bend 100% pull on Rt LB    Lumbar - Right Rotation 75%    Lumbar - Left Rotation 75%      Flexibility   Hamstrings mild tight end range Rt > Lt    Quadriceps WFL's    ITB WFL's    Piriformis WFL's      Palpation   Palpation comment tightness and tenderness medial groin area in pectinus and muscular tightness Rt anterior hip in psoas  and hip flexors into the quads; hip adductors;Rt SI area                           [facility] Adult PT Treatment/Exercise - 04/08/22 0001       Self-Care   Lifting modified deadlift 20 # x 10  from ~ 8 inch surface hinged hip core engaged      Lumbar Exercises: Prone   Other Prone Lumbar Exercises plank extended arms 1 min x 3 reps      Knee/Hip Exercises: Stretches   Passive Hamstring Stretch Limitations supine with strap - straight and adducted and abducted positions    Sports administrator Right;3 reps;30 seconds    Quad Stretch Limitations half kneeling frw and diagonal    Hip Flexor Stretch Right;3 reps;30 seconds    Hip Flexor Stretch Limitations supine thomas; repeated sitting    Other Knee/Hip Stretches adductor stretch supine with strap  - VC for neutral hip - no ER with stretch (PT assist to hold LE for 1 rep 30 sec x 3 reps total      Knee/Hip Exercises:  Aerobic   Tread Mill 2.8 mph x 7 min; backwards walking 1.5 mph x 5 min working on hip extension facilitating posterior hip and stretching hip flexors      Knee/Hip Exercises: Standing   Abduction Limitations airplane 5 reps 5 sec hold bilat    SLS 20-30 sec with UE movement, head turns    SLS with Vectors fwd to touch ~ 18 inch surface ~ 5 reps each side    Other Standing Knee Exercises rear foot elevated split squat x 10 bilat; lunge x 10 bilat; goblet squat 10# KB x 10 reps x 2 circuits    Other Standing Knee Exercises anti rotation with side stepping blue tband 2x10      Knee/Hip Exercises: Seated   Other Seated Knee/Hip Exercises high kneel reverse nordic hamstrings working hip flexors and quads eccentrically x 10 x 3 sec workiong on technique - keeping core tight and spine in neutral    Sit to [employer] 10 reps;without UE support      Moist Heat Therapy   Number Minutes Moist Heat 10 Minutes    Moist Heat Location Hip      Manual Therapy   Manual therapy comments skilled palpation to assess response to DN and manual work    Soft tissue mobilization deep tissue work through the hamstrings, psoas, hip flexors, adductors Rt side    Other Manual Therapy IASTM medial hamstrings - deep tissue work and Firefighter Dry Needling - 04/08/22 0001     Consent Given? Yes    Education Handout Provided Previously provided    Dry Needling Comments Rt    Electrical Stimulation Performed with Dry Needling Yes    E-stim with Dry Needling Details mApm current x 5 min    Other Dry Needling pectineus - palpable decrease in tightness    Adductor Response Palpable increased muscle length;Twitch response elicited    Rectus femoris Response Palpable increased muscle length;Twitch response elicited    Iliopsoas Response Palpable increased muscle length;Twitch response elicited                        PT Long Term Goals - 04/08/22 0838       PT LONG TERM GOAL  #1  Title Decrease Rt groin/hip/LB pain by 75-100% allowing patient to resume normal functional activities including frisbee    Time 6    Period Weeks    Status On-going    Target Date 05/20/22      PT LONG TERM GOAL #2   Title Further assessment of physical requirements for ultimate frisbee with appripriate recommendations for modifications and conditioning    Time 6    Period Weeks    Status On-going    Target Date 05/20/22      PT LONG TERM GOAL #3   Title Improve mobility with decreased palpable tightness/pain in the Rt lower quadrant    Time 6    Period Weeks    Status On-going    Target Date 05/20/22      PT LONG TERM GOAL #4   Title Indpendent in HEP(including aquatic program as indicated)    Time 6    Period Weeks    Status On-going    Target Date 05/20/22      PT LONG TERM GOAL #5   Title Improve functional limitation score to 82    Time 6    Period Weeks    Status On-going    Target Date 05/20/22                   Plan - 04/08/22 0816     Clinical Impression Statement Patient demonstrates increased trunk and LE mobility/flexibility and increasing core and LE strength. She is tolerating increased physical activity with less flare up of pain although she continues to have Rt grion pain with prolonged sitting, running, cutting, more intense change of direction. Gradually progressing toward stated goals of therapy. Will benefit from continued treatment and further assessment by pelvic PT.    Rehab Potential Good    PT Frequency 2x / week    PT Duration 6 weeks    PT Treatment/Interventions ADLs/Self Care Home Management;Aquatic Therapy;Cryotherapy;Electrical Stimulation;Iontophoresis 60m/ml Dexamethasone;Moist Heat;Ultrasound;Therapeutic activities;Therapeutic exercise;Balance training;Neuromuscular re-education;Patient/family education;Manual techniques;Dry needling;Taping    PT Next Visit Plan review and progress exercises; myofacial ball release work  anterior hip; continue DN and/or manual work through hip flexors and adductors as incidated; modalities as indicated; further assess mechanics of frisbee throw as symptoms subside.    PT Home Exercise Plan [id number]   Consulted and Agree with Plan of Care Patient             Patient will benefit from skilled therapeutic intervention in order to improve the following deficits and impairments:     Visit Diagnosis: Groin pain, right  Other symptoms and signs involving the musculoskeletal system  Acute pain of right shoulder  Muscle weakness (generalized)     Problem List Patient Active Problem List   Diagnosis Date Noted   Annual physical exam 12/20/2021   Chronic right hip pain 12/20/2021   Generalized anxiety disorder 09/13/2021   Skin lesion 09/04/2021   Chronic right shoulder pain 04/09/2021   Chronic right hamstring pain 04/09/2021    Viaan Knippenberg PNilda Simmer PT, MPH  04/08/2022, 10:19 AM  [facility] [place] [address]Phone: [phone]  Fax:  [phone] Name: NArmani GawlikMRN: [id number] of Birth: 104-Jun-1992

## 2022-05-19 ENCOUNTER — Ambulatory Visit: Payer: No Typology Code available for payment source | Attending: Sports Medicine | Admitting: Physical Therapy

## 2022-05-19 ENCOUNTER — Encounter: Payer: Self-pay | Admitting: Physical Therapy

## 2022-05-19 ENCOUNTER — Other Ambulatory Visit: Payer: Self-pay

## 2022-05-19 DIAGNOSIS — M62838 Other muscle spasm: Secondary | ICD-10-CM | POA: Diagnosis present

## 2022-05-19 DIAGNOSIS — R1031 Right lower quadrant pain: Secondary | ICD-10-CM | POA: Diagnosis present

## 2022-05-19 DIAGNOSIS — M6281 Muscle weakness (generalized): Secondary | ICD-10-CM | POA: Insufficient documentation

## 2022-05-19 NOTE — Therapy (Signed)
OUTPATIENT PHYSICAL THERAPY FEMALE PELVIC EVALUATION   Patient Name: Tracy Schneider MRN: [id number] DOB:1991/07/09, 31 y.o., female Today's Date: 05/19/2022   PT End of Session - 05/19/22 1157     Visit Number 1    Date for PT Re-Evaluation 08/11/22    Authorization Type [facility]    PT Start Time [phone]    PT Stop Time 0927    PT Time Calculation (min) 40 min    Activity Tolerance Patient tolerated treatment well    Behavior During Therapy [facility] for tasks assessed/performed             History reviewed. No pertinent past medical history. Past Surgical History:  Procedure Laterality Date   RHINOPLASTY  10/2005   septorhinoplasty   Patient Active Problem List   Diagnosis Date Noted   Annual physical exam 12/20/2021   Chronic right hip pain 12/20/2021   Generalized anxiety disorder 09/13/2021   Skin lesion 09/04/2021   Chronic right shoulder pain 04/09/2021   Chronic right hamstring pain 04/09/2021    PCP: Monica Becton, MD  REFERRING PROVIDER: Monica Becton, MD  REFERRING DIAG: [phone] (ICD-10-CM) - Chronic right hip pain  THERAPY DIAG:  Groin pain, right  Muscle weakness (generalized)  Other muscle spasm  Rationale for Evaluation and Treatment Rehabilitation  ONSET DATE: march 2023  SUBJECTIVE:                                                                                                                                                                                           SUBJECTIVE STATEMENT: Pt has been having Rt groin pain.  Had chronic hip flexor and groin pain.   Fluid intake: Yes: water     PAIN:  Are you having pain? Yes NPRS scale: 2/10 Pain location: Anterior  Pain type: achy pressure Pain description: intermittent   Aggravating factors: walking and certain movements, after sitting a lot Relieving factors: NSAIDs and rest when it flares up  PRECAUTIONS: None  WEIGHT BEARING RESTRICTIONS No  FALLS:  Has  patient fallen in last 6 months? No  LIVING ENVIRONMENT: Lives with: lives with their family and lives with their spouse Lives in: House/apartment   OCCUPATION: sits/desk; FNP  PLOF: Independent  PATIENT GOALS build up strength to play ultimate frisbee without flare ups  PERTINENT HISTORY:   Sexual abuse: No  BOWEL MOVEMENT Pain with bowel movement: No  URINATION Pain with urination: No Urgency: No Frequency: normal Leakage: (stress incontinence during physical activities only after big jump or direction change)   INTERCOURSE Pain with intercourse:  No   PREGNANCY Nulliparous  PROLAPSE None  OBJECTIVE:   DIAGNOSTIC FINDINGS:    PATIENT SURVEYS:    PFIQ-7   COGNITION:  Overall cognitive status: Within functional limits for tasks assessed     SENSATION:   MUSCLE LENGTH: Hamstrings:  Thomas test:   LUMBAR SPECIAL TESTS:  ASLR - compression makes harder; only slightly easier with gluteal support (Rt side)  FUNCTIONAL TESTS:  Fwd bend normal pelvic alignment  GAIT:  Comments: WFL                POSTURE: increased lumbar lordosis and anterior pelvic tilt   PELVIC ALIGNMENT:  LUMBARAROM/PROM  A/PROM A/PROM  eval  Flexion   Extension   Right lateral flexion   Left lateral flexion   Right rotation   Left rotation    (Blank rows = not tested)  LOWER EXTREMITY ROM:  Passive ROM Right eval Left eval  Hip flexion    Hip extension    Hip abduction    Hip adduction    Hip internal rotation    Hip external rotation    Knee flexion    Knee extension    Ankle dorsiflexion    Ankle plantarflexion    Ankle inversion    Ankle eversion     (Blank rows = not tested)  LOWER EXTREMITY MMT:  MMT Right eval Left eval  Hip flexion    Hip extension    Hip abduction    Hip adduction    Hip internal rotation    Hip external rotation    Knee flexion    Knee extension    Ankle dorsiflexion    Ankle plantarflexion    Ankle  inversion    Ankle eversion      PALPATION:   General  Rt paraspinals more tension; Rt hip flexors tight                External Perineal Exam transverse peroneus tight Rt side                             Internal Pelvic Floor obturator tight bil; Rt coccygeus  Patient confirms identification and approves PT to assess internal pelvic floor and treatment Yes No emotional/communication barriers or cognitive limitation. Patient is motivated to learn. Patient understands and agrees with treatment goals and plan. PT explains patient will be examined in standing, sitting, and lying down to see how their muscles and joints work. When they are ready, they will be asked to remove their underwear so PT can examine their perineum. The patient is also given the option of providing their own chaperone as one is not provided in our facility. The patient also has the right and is explained the right to defer or refuse any part of the evaluation or treatment including the internal exam. With the patient's consent, PT will use one gloved finger to gently assess the muscles of the pelvic floor, seeing how well it contracts and relaxes and if there is muscle symmetry. After, the patient will get dressed and PT and patient will discuss exam findings and plan of care. PT and patient discuss plan of care, schedule, attendance policy and HEP activities.   PELVIC MMT:   MMT eval  Vaginal   Internal Anal Sphincter   External Anal Sphincter   Puborectalis   Diastasis Recti   (Blank rows = not tested)        TONE: High Rt>Lt  PROLAPSE:   TODAY'S TREATMENT  EVAL  and wand info and demo   PATIENT EDUCATION:  Education details: wand demo Person educated: Patient Education method: Customer service manager Education comprehension: verbalized understanding   HOME EXERCISE PROGRAM: Order and how to use wand  ASSESSMENT:  CLINICAL IMPRESSION: Patient is an active 31 y.o. female who was seen today for  physical therapy evaluation and treatment for groin/hip pain on the Rt side. Pt has history of pain and has recently had it flare up to labia.  Pt has tension throughout the Rt side and particularly muscle that encompass the pudendal nerve.  Pt demonstrates a little hip weakness on the Rt side and tension based on ASLR test.  Pt will benefit from skilled PT to address these impairments so that she can improve ability to do functional and recreational activities without pain.   OBJECTIVE IMPAIRMENTS decreased coordination, decreased strength, increased muscle spasms, impaired tone, and pain.   ACTIVITY LIMITATIONS lifting, bending, and continence  PARTICIPATION LIMITATIONS: community activity and team sports and exercise  PERSONAL FACTORS Time since onset of injury/illness/exacerbation are also affecting patient's functional outcome.   REHAB POTENTIAL: Excellent  CLINICAL DECISION MAKING: Evolving/moderate complexity  EVALUATION COMPLEXITY: Low   GOALS: Goals reviewed with patient? Yes  SHORT TERM GOALS: = Long term goals    LONG TERM GOALS: Target date: 08/11/2022   Pt will report 50% less general pain that she experiences throughout the day Baseline:  Goal status: INITIAL  2.  Pt will be able to run with quick directional changes without increased pain Baseline:  Goal status: INITIAL  3.  Pt will be independent with advanced HEP to maintain improvements made throughout therapy  Baseline:  Goal status: INITIAL  4.  Pt will have normal active straight leg raise with pelvic compression in order to demonstrate normal muscle tone throughout the core Baseline:  Goal status: INITIAL   PLAN: PT FREQUENCY: 1x/week  PT DURATION: 12 weeks  PLANNED INTERVENTIONS: Therapeutic exercises, Therapeutic activity, Neuromuscular re-education, Balance training, Gait training, Patient/Family education, Self Care, Joint mobilization, Dry Needling, Electrical stimulation, Cryotherapy, Moist  heat, Taping, Biofeedback, Manual therapy, and Re-evaluation  PLAN FOR NEXT SESSION: internal STM obdurator and coccygeus; dry needling coccygus; and review using pelvic wand;    Jule Ser, PT 05/19/2022, 12:28 PM

## 2022-05-26 ENCOUNTER — Encounter: Payer: Self-pay | Admitting: Rehabilitative and Restorative Service Providers"

## 2022-05-26 ENCOUNTER — Other Ambulatory Visit (HOSPITAL_COMMUNITY): Payer: Self-pay

## 2022-06-12 ENCOUNTER — Encounter: Payer: Self-pay | Admitting: Physical Therapy

## 2022-06-12 ENCOUNTER — Ambulatory Visit: Payer: No Typology Code available for payment source | Attending: Sports Medicine | Admitting: Physical Therapy

## 2022-06-12 DIAGNOSIS — M6281 Muscle weakness (generalized): Secondary | ICD-10-CM | POA: Diagnosis present

## 2022-06-12 DIAGNOSIS — R1031 Right lower quadrant pain: Secondary | ICD-10-CM | POA: Diagnosis present

## 2022-06-12 DIAGNOSIS — M62838 Other muscle spasm: Secondary | ICD-10-CM | POA: Insufficient documentation

## 2022-06-12 NOTE — Therapy (Signed)
OUTPATIENT PHYSICAL THERAPY FEMALE PELVIC TREATMENT   Patient Name: Tracy Schneider MRN: [id number] DOB:Jul 10, 1991, 31 y.o., female Today's Date: 06/12/2022   PT End of Session - 06/12/22 1057     Visit Number 2    Date for PT Re-Evaluation 08/11/22    Authorization Type [facility]    PT Start Time [phone]    PT Stop Time 0930    PT Time Calculation (min) 40 min    Activity Tolerance Patient tolerated treatment well    Behavior During Therapy [facility] for tasks assessed/performed              History reviewed. No pertinent past medical history. Past Surgical History:  Procedure Laterality Date   RHINOPLASTY  10/2005   septorhinoplasty   Patient Active Problem List   Diagnosis Date Noted   Annual physical exam 12/20/2021   Chronic right hip pain 12/20/2021   Generalized anxiety disorder 09/13/2021   Skin lesion 09/04/2021   Chronic right shoulder pain 04/09/2021   Chronic right hamstring pain 04/09/2021    PCP: Monica Becton, MD  REFERRING PROVIDER: Monica Becton, MD  REFERRING DIAG: [phone] (ICD-10-CM) - Chronic right hip pain  THERAPY DIAG:  Groin pain, right  Muscle weakness (generalized)  Other muscle spasm  Rationale for Evaluation and Treatment Rehabilitation  ONSET DATE: march 2023  SUBJECTIVE:                                                                                                                                                                                           SUBJECTIVE STATEMENT: Pt got the wand and used it a couple times.  I still feel like I am not engaging my core when doing my exercises.  Fluid intake: Yes: water     PAIN:  Are you having pain? Yes NPRS scale: 2/10 Pain location: Anterior  Pain type: achy pressure Pain description: intermittent   Aggravating factors: walking and certain movements, after sitting a lot Relieving factors: NSAIDs and rest when it flares up  PRECAUTIONS: None  WEIGHT  BEARING RESTRICTIONS No  FALLS:  Has patient fallen in last 6 months? No  LIVING ENVIRONMENT: Lives with: lives with their family and lives with their spouse Lives in: House/apartment   OCCUPATION: sits/desk; FNP  PLOF: Independent  PATIENT GOALS build up strength to play ultimate frisbee without flare ups  PERTINENT HISTORY:   Sexual abuse: No  BOWEL MOVEMENT Pain with bowel movement: No  URINATION Pain with urination: No Urgency: No Frequency: normal Leakage: (stress incontinence during physical activities only after big jump or direction change)   INTERCOURSE Pain with intercourse:  No   PREGNANCY Nulliparous  PROLAPSE None    OBJECTIVE:   DIAGNOSTIC FINDINGS:    PATIENT SURVEYS:    PFIQ-7   COGNITION:  Overall cognitive status: Within functional limits for tasks assessed     SENSATION:   MUSCLE LENGTH: Hamstrings:  Thomas test:   LUMBAR SPECIAL TESTS:  ASLR - compression makes harder; only slightly easier with gluteal support (Rt side)  FUNCTIONAL TESTS:  Fwd bend normal pelvic alignment  GAIT:  Comments: WFL                POSTURE: increased lumbar lordosis and anterior pelvic tilt   PELVIC ALIGNMENT:  LUMBARAROM/PROM  A/PROM A/PROM  eval  Flexion   Extension   Right lateral flexion   Left lateral flexion   Right rotation   Left rotation    (Blank rows = not tested)  LOWER EXTREMITY ROM:  Passive ROM Right eval Left eval  Hip flexion    Hip extension    Hip abduction    Hip adduction    Hip internal rotation    Hip external rotation    Knee flexion    Knee extension    Ankle dorsiflexion    Ankle plantarflexion    Ankle inversion    Ankle eversion     (Blank rows = not tested)  LOWER EXTREMITY MMT:  MMT Right eval Left eval  Hip flexion    Hip extension    Hip abduction    Hip adduction    Hip internal rotation    Hip external rotation    Knee flexion    Knee extension    Ankle dorsiflexion     Ankle plantarflexion    Ankle inversion    Ankle eversion      PALPATION:   General  Rt paraspinals more tension; Rt hip flexors tight                External Perineal Exam transverse peroneus tight Rt side                             Internal Pelvic Floor obturator tight bil; Rt coccygeus  Patient confirms identification and approves PT to assess internal pelvic floor and treatment Yes No emotional/communication barriers or cognitive limitation. Patient is motivated to learn. Patient understands and agrees with treatment goals and plan. PT explains patient will be examined in standing, sitting, and lying down to see how their muscles and joints work. When they are ready, they will be asked to remove their underwear so PT can examine their perineum. The patient is also given the option of providing their own chaperone as one is not provided in our facility. The patient also has the right and is explained the right to defer or refuse any part of the evaluation or treatment including the internal exam. With the patient's consent, PT will use one gloved finger to gently assess the muscles of the pelvic floor, seeing how well it contracts and relaxes and if there is muscle symmetry. After, the patient will get dressed and PT and patient will discuss exam findings and plan of care. PT and patient discuss plan of care, schedule, attendance policy and HEP activities.   PELVIC MMT:   MMT eval  Vaginal   Internal Anal Sphincter   External Anal Sphincter   Puborectalis   Diastasis Recti   (Blank rows = not tested)  TONE: High Rt>Lt  PROLAPSE:   TODAY'S TREATMENT  Date: 06/12/22 Manual: Patient confirms identification and approves physical therapist to perform internal soft tissue work  : Rt ileococcygeus, and obturator; left levators  Nuero Re-ed: Transversus abdominus and glute med activating with rotational forces: Squat jump with blue band rotation pull - 12 x each  side Running lunge rotation with blue band - 12x each Quadruped with pelvic shift on yoga block - 12 x each side Tall kneel with small "bridge" and rotational hip activation blue band - 10x Half kneel with blue band press outs in diagonal - 12x each side  Exercises: *above all done with neuro re-ed activation   PATIENT EDUCATION:  Education details: Access Code: [email or web address] Person educated: Patient Education method: Medical illustrator Education comprehension: verbalized understanding   HOME EXERCISE PROGRAM: Access Code: [facility] URL: [email or web address] Date: 06/12/2022 Prepared by: Dwana Curd  Exercises - Squat Jumps  - 1 x daily - 7 x weekly - 3 sets - 10 reps - Lunge with Resistance Rotation Pull  - 1 x daily - 7 x weekly - 3 sets - 10 reps - Quadruped Alternating Arm Lift  - 1 x daily - 7 x weekly - 2 sets - 10 reps  ASSESSMENT:  CLINICAL IMPRESSION: Patient had tension release of the ileococcygeus.  Pt is ind with using wand and now has better idea of how to find the correct location of the tension.  Pt is working on muscle imbalances to reduce the overactivation of the pelvic floor and was given exercises shown above to work on hip and core stability with complex motor patterns needed for cutting and running in her sport.  Pt will benefit from skilled PT to continue to progress strength for returning to sport without pain.   OBJECTIVE IMPAIRMENTS decreased coordination, decreased strength, increased muscle spasms, impaired tone, and pain.   ACTIVITY LIMITATIONS lifting, bending, and continence  PARTICIPATION LIMITATIONS: community activity and team sports and exercise  PERSONAL FACTORS Time since onset of injury/illness/exacerbation are also affecting patient's functional outcome.   REHAB POTENTIAL: Excellent  CLINICAL DECISION MAKING: Evolving/moderate complexity  EVALUATION COMPLEXITY: Low   GOALS: Goals reviewed with  patient? Yes  SHORT TERM GOALS: = Long term goals    LONG TERM GOALS: Target date: 08/11/2022  Updated 06/12/22  Pt will report 50% less general pain that she experiences throughout the day Baseline:  Goal status: IN PROGRESS  2.  Pt will be able to run with quick directional changes without increased pain Baseline:  Goal status: IN PROGRESS  3.  Pt will be independent with advanced HEP to maintain improvements made throughout therapy  Baseline:  Goal status: IN PROGRESS  4.  Pt will have normal active straight leg raise with pelvic compression in order to demonstrate normal muscle tone throughout the core Baseline:  Goal status: IN PROGRESS   PLAN: PT FREQUENCY: 1x/week  PT DURATION: 12 weeks  PLANNED INTERVENTIONS: Therapeutic exercises, Therapeutic activity, Neuromuscular re-education, Balance training, Gait training, Patient/Family education, Self Care, Joint mobilization, Dry Needling, Electrical stimulation, Cryotherapy, Moist heat, Taping, Biofeedback, Manual therapy, and Re-evaluation  PLAN FOR NEXT SESSION: internal STM obdurator and coccygeus muscles as needed (see if she could find the same spots with wand); progress core with single leg ex's using weight and resistance band; side lunge on BOSU; skater lunge with resistance, quadruped resistance and primal push variations    Brayton Caves Tiajah Oyster, PT 06/12/2022, 10:57 AM

## 2022-06-17 ENCOUNTER — Ambulatory Visit: Payer: No Typology Code available for payment source | Admitting: Physical Therapy

## 2022-06-19 ENCOUNTER — Other Ambulatory Visit: Payer: Self-pay

## 2022-06-19 MED ORDER — COVID-19 MRNA 2023-2024 VACCINE (COMIRNATY) 0.3 ML INJECTION
0.3000 mL | Freq: Once | INTRAMUSCULAR | 0 refills | Status: AC
  Filled 2022-06-19: qty 0.3, 1d supply, fill #0

## 2022-06-19 MED ORDER — FLUARIX QUADRIVALENT 0.5 ML IM SUSY
0.5000 mL | PREFILLED_SYRINGE | Freq: Once | INTRAMUSCULAR | 0 refills | Status: AC
  Filled 2022-06-19: qty 0.5, 1d supply, fill #0

## 2022-07-14 ENCOUNTER — Ambulatory Visit: Payer: No Typology Code available for payment source | Admitting: Physical Therapy

## 2022-08-06 ENCOUNTER — Ambulatory Visit: Payer: No Typology Code available for payment source | Admitting: Physical Therapy

## 2022-10-28 ENCOUNTER — Inpatient Hospital Stay: Admit: 2022-10-28

## 2022-11-12 LAB — LAB REPORT - SCANNED
A1c: 5.2
EGFR: 120

## 2022-12-05 IMAGING — MR MR SHOULDER*R* W/CM
5 of 6 series · 33 of 40 positions shown · IV contrast (agent unspecified)
Comparison: None.

CLINICAL DATA: Limited range of motion for 2 months. No known
injury.

EXAM:
MR ARTHROGRAM OF THE RIGHT SHOULDER
TECHNIQUE: Multiplanar, multisequence MR imaging of the right shoulder was
performed following the administration of intra-articular contrast.
CONTRAST:  See Injection Documentation.

[Series 3: T1 fat-sat · axial · 4.0mm · 0.55mm/px · z∈[-68,+33]mm · 6 of 22 slices shown (1 of 3)]
[im 1/22]
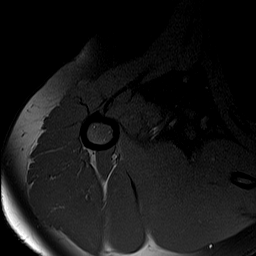
[im 5/22]
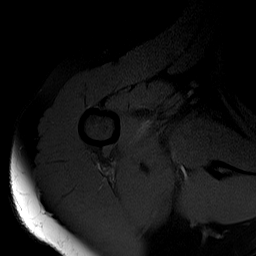
[im 9/22]
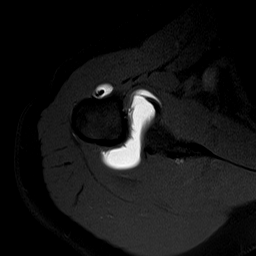
[im 13/22]
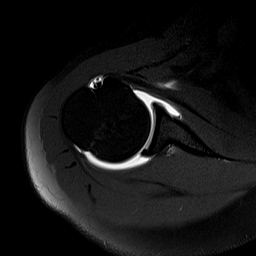
[im 17/22]
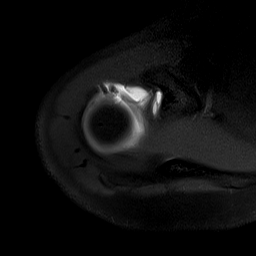
[im 22/22]
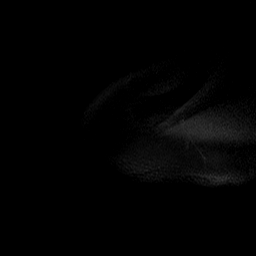

[Series 4: T1 fat-sat · oblique · 4.0mm · 0.27mm/px · 6 of 20 slices shown (2 of 3)]
[im 1/20]
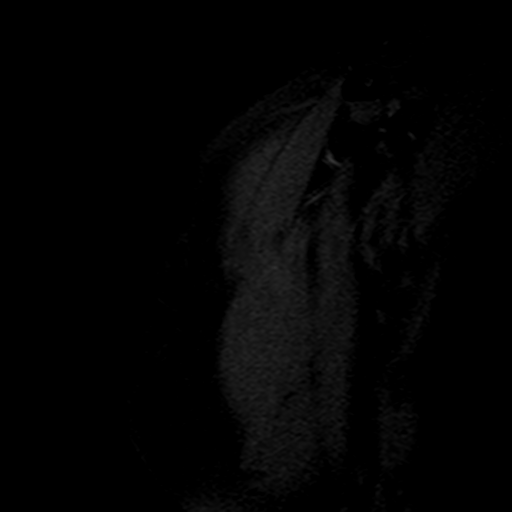
[im 4/20]
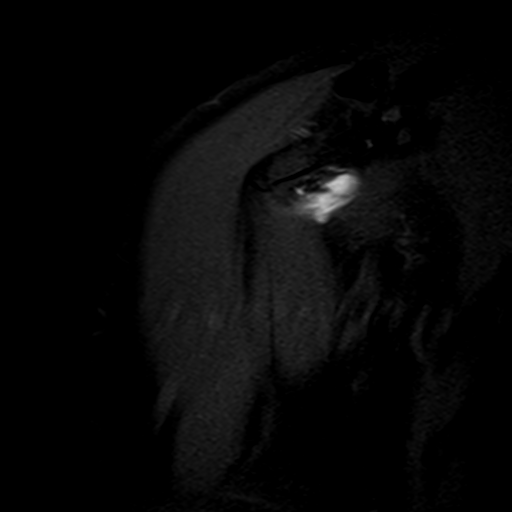
[im 8/20]
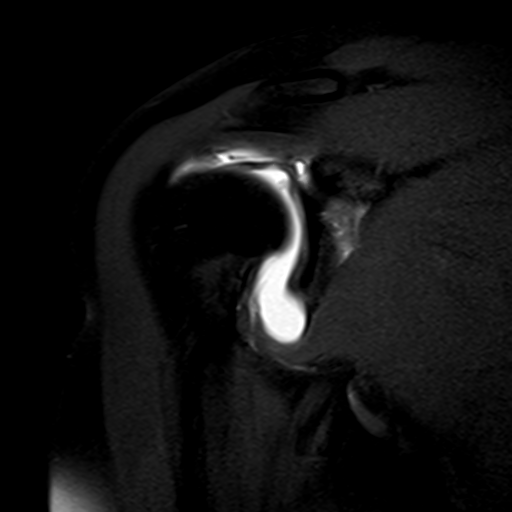
[im 12/20]
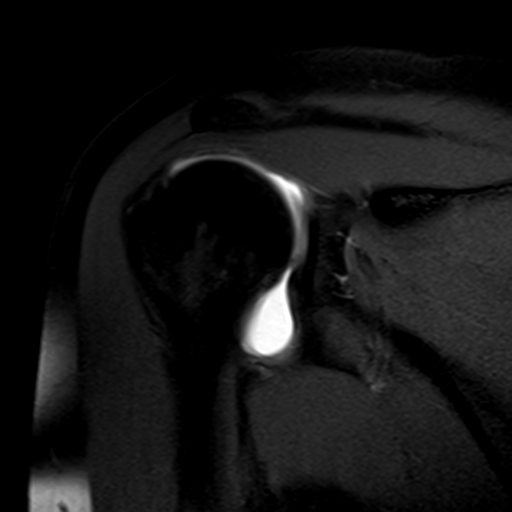
[im 16/20]
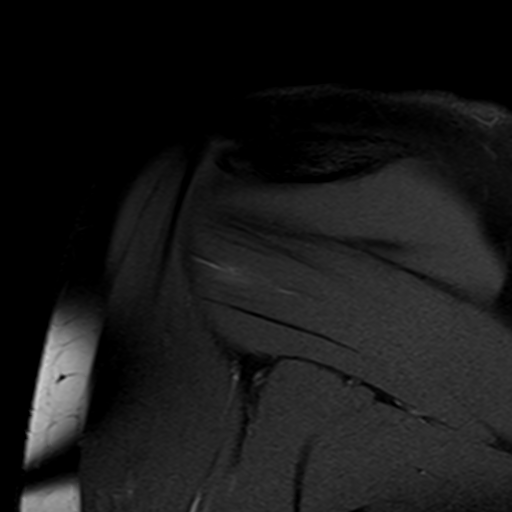
[im 20/20]
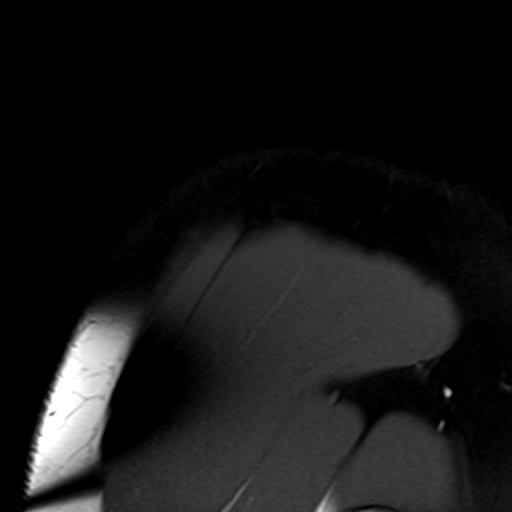

[Series 5: T2 fat-sat · oblique · 4.0mm · 0.27mm/px · 7 of 20 slices shown (1 of 2)]
[im 1/20]
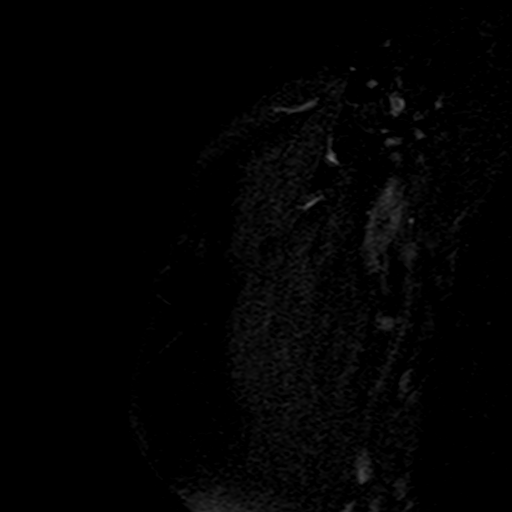
[im 4/20]
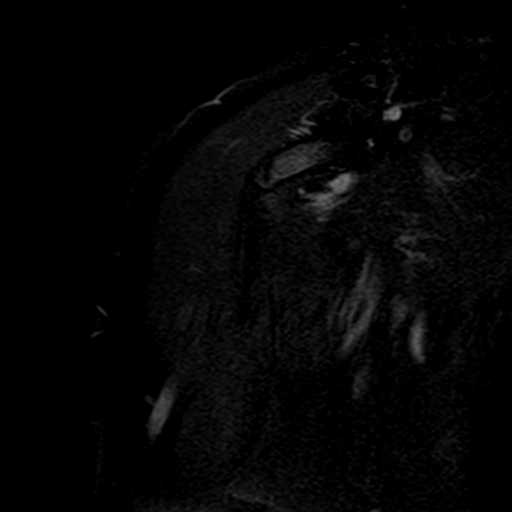
[im 7/20]
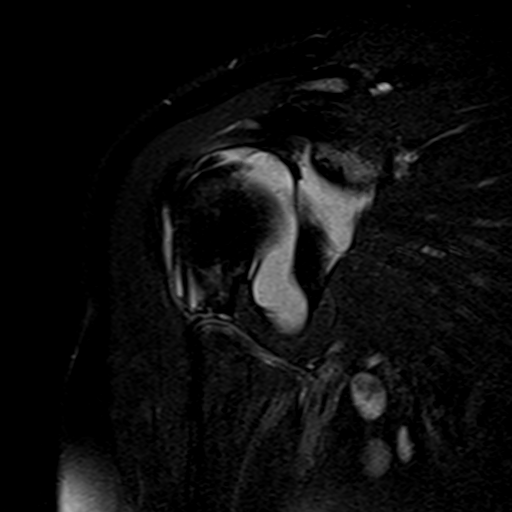
[im 10/20]
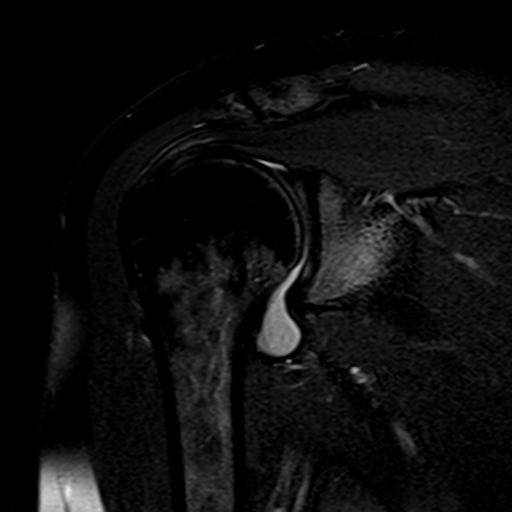
[im 13/20]
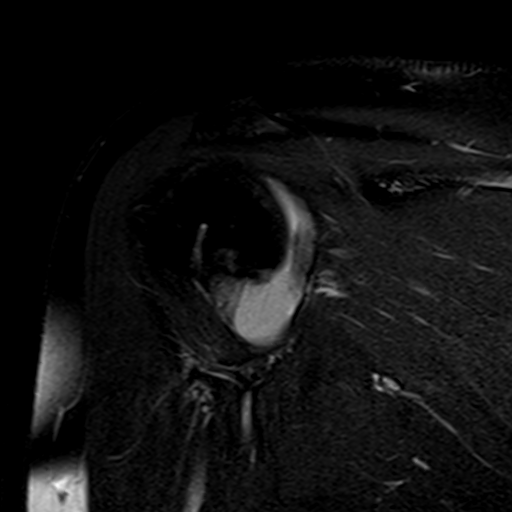
[im 16/20]
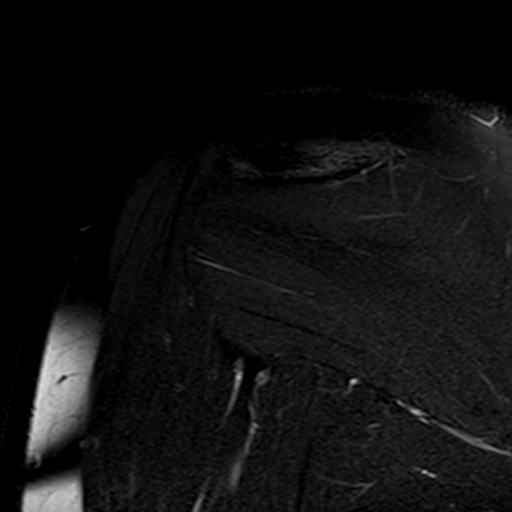
[im 20/20]
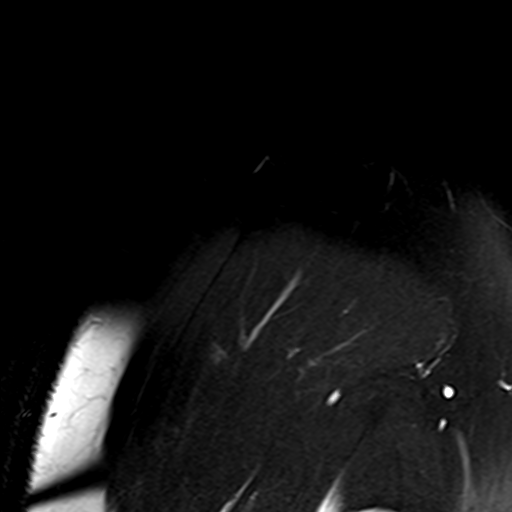

[Series 6: T1 fat-sat · oblique · non-contrast · 4.0mm · 0.55mm/px · 7 of 20 slices shown (3 of 3)]
[im 1/20]
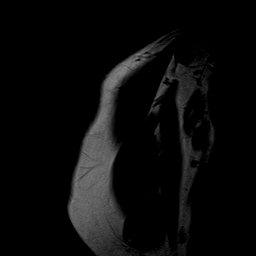
[im 4/20]
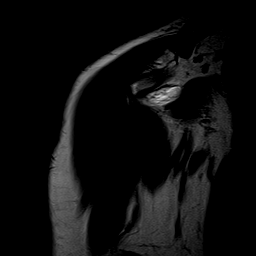
[im 7/20]
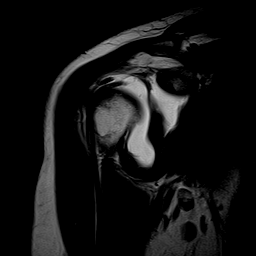
[im 10/20]
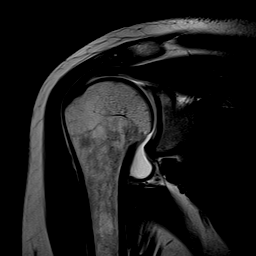
[im 13/20]
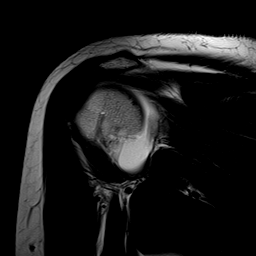
[im 16/20]
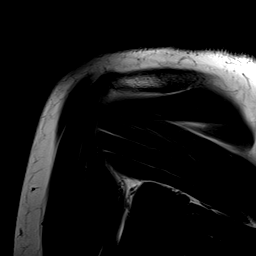
[im 20/20]
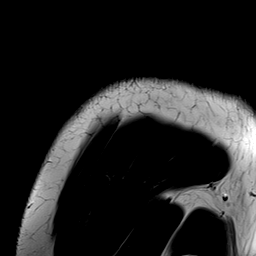

[Series 7: T2 fat-sat · oblique · 4.0mm · 0.55mm/px · 7 of 20 slices shown (2 of 2)]
[im 1/20]
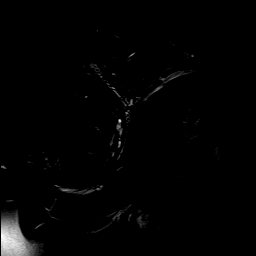
[im 4/20]
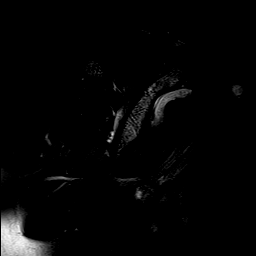
[im 7/20]
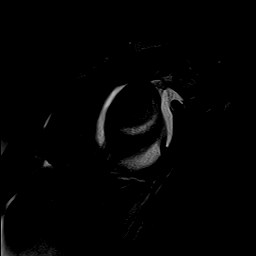
[im 10/20]
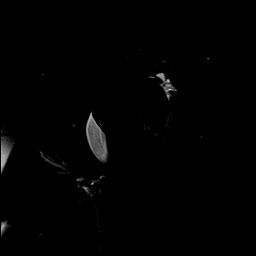
[im 13/20]
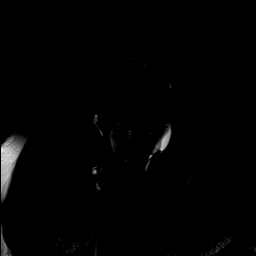
[im 16/20]
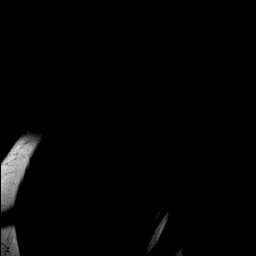
[im 20/20]
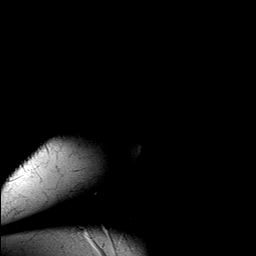

[33 of 40 positions shown; findings below may reference images not displayed]

FINDINGS: Rotator cuff: Minimal tendinosis of the supraspinatus tendon.
Infraspinatus tendon is intact. Teres minor tendon is intact.
Subscapularis tendon is intact.

Muscles: No muscle atrophy or edema. No intramuscular fluid
collection or hematoma.

Biceps Long Head: Mild tendinosis of the intra-articular portion of
the long head of the biceps tendon. Longitudinal split tear of the
intra-articular portion and proximal extra-articular portion of the
long head of the biceps tendon.

Acromioclavicular Joint: Normal acromioclavicular joint. No
subacromial/subdeltoid bursal fluid.

Glenohumeral Joint: Intraarticular contrast distending the joint
capsule. Normal glenohumeral ligaments. no chondral defect.

Labrum: Intact

Bones: No fracture or dislocation. No aggressive osseous lesion.

Other: No fluid collection or hematoma.
IMPRESSION: 1. Minimal tendinosis of the supraspinatus tendon.
2. Mild tendinosis of the intra-articular portion of the long head
of the biceps tendon. Longitudinal split tear of the intra-articular
portion and proximal extra-articular portion of the long head of the
biceps tendon.

## 2022-12-06 ENCOUNTER — Other Ambulatory Visit: Payer: Self-pay | Admitting: Sports Medicine

## 2023-01-06 DIAGNOSIS — L811 Chloasma: Secondary | ICD-10-CM | POA: Diagnosis not present

## 2023-01-06 DIAGNOSIS — Z7189 Other specified counseling: Secondary | ICD-10-CM | POA: Diagnosis not present

## 2023-01-06 DIAGNOSIS — L814 Other melanin hyperpigmentation: Secondary | ICD-10-CM | POA: Diagnosis not present

## 2023-01-06 DIAGNOSIS — D225 Melanocytic nevi of trunk: Secondary | ICD-10-CM | POA: Diagnosis not present

## 2023-01-13 ENCOUNTER — Ambulatory Visit (INDEPENDENT_AMBULATORY_CARE_PROVIDER_SITE_OTHER): Payer: 59 | Admitting: Sports Medicine

## 2023-01-13 ENCOUNTER — Other Ambulatory Visit: Payer: Self-pay | Admitting: Sports Medicine

## 2023-01-13 ENCOUNTER — Encounter: Payer: Self-pay | Admitting: Sports Medicine

## 2023-01-13 VITALS — BP 121/78 | HR 63 | Ht 64.0 in | Wt 148.0 lb

## 2023-01-13 DIAGNOSIS — E785 Hyperlipidemia, unspecified: Secondary | ICD-10-CM | POA: Diagnosis not present

## 2023-01-13 DIAGNOSIS — Z0000 Encounter for general adult medical examination without abnormal findings: Secondary | ICD-10-CM | POA: Diagnosis not present

## 2023-01-13 MED ORDER — ROSUVASTATIN CALCIUM 5 MG PO TABS
5.0000 mg | ORAL_TABLET | Freq: Every day | ORAL | 3 refills | Status: DC
Start: 2023-01-13 — End: 2023-01-13

## 2023-01-13 MED ORDER — ROSUVASTATIN CALCIUM 5 MG PO TABS
5.0000 mg | ORAL_TABLET | Freq: Every day | ORAL | 3 refills | Status: DC
Start: 2023-01-13 — End: 2024-02-29
  Filled 2023-07-03: qty 90, 90d supply, fill #0
  Filled 2023-10-09: qty 60, 60d supply, fill #1

## 2023-01-13 NOTE — Progress Notes (Signed)
Subjective:    CC: Annual Physical Exam  HPI:  This patient is here for their annual physical  I reviewed the past medical history, family history, social history, surgical history, and allergies today and no changes were needed.  Please see the problem list section below in epic for further details.  Past Medical History: No past medical history on file. Past Surgical History: Past Surgical History:  Procedure Laterality Date   RHINOPLASTY  10/2005   septorhinoplasty   Social History: Social History   Socioeconomic History   Marital status: Single    Spouse name: Not on file   Number of children: Not on file   Years of education: Not on file   Highest education level: Not on file  Occupational History    Employer: [employer]  Tobacco Use   Smoking status: Never   Smokeless tobacco: Never  Vaping Use   Vaping Use: Never used  Substance and Sexual Activity   Alcohol use: Not Currently   Drug use: Never   Sexual activity: Yes    Partners: Male    Birth control/protection: Inserts  Other Topics Concern   Not on file  Social History Narrative   Not on file   Social Determinants of Health   Financial Resource Strain: Not on file  Food Insecurity: Not on file  Transportation Needs: Not on file  Physical Activity: Not on file  Stress: Not on file  Social Connections: Not on file   Family History: Family History  Problem Relation Age of Onset   High blood pressure Maternal Grandmother    Stroke Paternal Grandmother    Colon cancer Paternal Grandfather    Healthy Mother    Healthy Father    Allergies: No Known Allergies Medications: See med rec.  Review of Systems: No headache, visual changes, nausea, vomiting, diarrhea, constipation, dizziness, abdominal pain, skin rash, fevers, chills, night sweats, swollen lymph nodes, weight loss, chest pain, body aches, joint swelling, muscle aches, shortness of breath, mood changes, visual or auditory  hallucinations.  Objective:    General: Well Developed, well nourished, and in no acute distress.  Neuro: Alert and oriented x3, extra-ocular muscles intact, sensation grossly intact. Cranial nerves II through XII are intact, motor, sensory, and coordinative functions are all intact. HEENT: Normocephalic, atraumatic, pupils equal round reactive to light, neck supple, no masses, no lymphadenopathy, thyroid nonpalpable. Oropharynx, nasopharynx, external ear canals are unremarkable. Skin: Warm and dry, no rashes noted.  Cardiac: Regular rate and rhythm, no murmurs rubs or gallops.  Respiratory: Clear to auscultation bilaterally. Not using accessory muscles, speaking in full sentences.  Abdominal: Soft, nontender, nondistended, positive bowel sounds, no masses, no organomegaly.  Musculoskeletal: Shoulder, elbow, wrist, hip, knee, ankle stable, and with full range of motion.  Impression and Recommendations:    The patient was counselled, risk factors were discussed, anticipatory guidance given.  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   Hyperlipidemia Tracy Schneider does have hyperlipidemia, LDL 167, total cholesterol 244, triglycerides, HDL were normal. She exercises regularly with resistance training, ultimate Frisbee, her diet is adequate, for this reason we will go and start Crestor 5 with a 30-month recheck.   ____________________________________________ Ihor Austin. Benjamin Stain, M.D., ABFM., CAQSM., AME. Primary Care and Sports Medicine [facility]  Adjunct Professor of [employer]  [employer]  Risk manager

## 2023-01-13 NOTE — Assessment & Plan Note (Signed)
Tracy Schneider does have hyperlipidemia, LDL 167, total cholesterol 244, triglycerides, HDL were normal. She exercises regularly with resistance training, ultimate Frisbee, her diet is adequate, for this reason we will go and start Crestor 5 with a 24-month recheck.

## 2023-01-13 NOTE — Assessment & Plan Note (Signed)
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

## 2023-01-16 ENCOUNTER — Ambulatory Visit: Payer: 59 | Admitting: Family Medicine

## 2023-06-11 ENCOUNTER — Other Ambulatory Visit: Payer: Self-pay

## 2023-06-11 MED ORDER — COVID-19 MRNA VAC-TRIS(PFIZER) 30 MCG/0.3ML IM SUSY
0.3000 mL | PREFILLED_SYRINGE | Freq: Once | INTRAMUSCULAR | 0 refills | Status: AC
  Filled 2023-06-11: qty 0.3, 1d supply, fill #0

## 2023-06-11 MED ORDER — INFLUENZA VIRUS VACC SPLIT PF (FLUZONE) 0.5 ML IM SUSY
0.5000 mL | PREFILLED_SYRINGE | Freq: Once | INTRAMUSCULAR | 0 refills | Status: AC
  Filled 2023-06-11: qty 0.5, 1d supply, fill #0

## 2023-06-12 ENCOUNTER — Other Ambulatory Visit: Payer: Self-pay | Admitting: Medical Genetics

## 2023-06-12 DIAGNOSIS — Z006 Encounter for examination for normal comparison and control in clinical research program: Secondary | ICD-10-CM

## 2023-07-03 ENCOUNTER — Other Ambulatory Visit (HOSPITAL_COMMUNITY): Payer: Self-pay

## 2023-07-03 ENCOUNTER — Other Ambulatory Visit: Payer: Self-pay

## 2023-07-03 MED FILL — Norgestimate & Ethinyl Estradiol Tab 0.25 MG-35 MCG: ORAL | 84 days supply | Qty: 84 | Fill #0 | Status: CN

## 2023-07-10 ENCOUNTER — Other Ambulatory Visit (HOSPITAL_COMMUNITY): Payer: Self-pay

## 2023-07-20 ENCOUNTER — Other Ambulatory Visit (HOSPITAL_COMMUNITY): Payer: Self-pay

## 2023-07-20 MED FILL — Norgestimate & Ethinyl Estradiol Tab 0.25 MG-35 MCG: ORAL | 84 days supply | Qty: 84 | Fill #0 | Status: AC

## 2023-07-21 ENCOUNTER — Other Ambulatory Visit (HOSPITAL_COMMUNITY)
Admission: RE | Admit: 2023-07-21 | Discharge: 2023-07-21 | Disposition: A | Discharge status: Home / Self Care | Payer: 59 | Source: Ambulatory Visit | Attending: Oncology | Admitting: Oncology

## 2023-07-21 DIAGNOSIS — Z006 Encounter for examination for normal comparison and control in clinical research program: Secondary | ICD-10-CM | POA: Insufficient documentation

## 2023-07-21 DIAGNOSIS — Z0189 Encounter for other specified special examinations: Secondary | ICD-10-CM | POA: Insufficient documentation

## 2023-07-21 LAB — HEMOGLOBIN A1C
Hgb A1c MFr Bld: 4.9 % (ref 4.8–5.6)
Mean Plasma Glucose: 93.93 mg/dL

## 2023-07-21 LAB — LIPID PANEL
Cholesterol: 182 mg/dL (ref 0–200)
HDL: 50 mg/dL (ref >40)
LDL Cholesterol: 115 mg/dL — ABNORMAL HIGH (ref 0–99)
Total CHOL/HDL Ratio: 3.6 {ratio}
Triglycerides: 85 mg/dL (ref <150)
VLDL: 17 mg/dL (ref 0–40)

## 2023-07-21 LAB — COMPREHENSIVE METABOLIC PANEL
ALT: 17 U/L (ref 0–44)
AST: 17 U/L (ref 15–41)
Albumin: 3.7 g/dL (ref 3.5–5.0)
Alkaline Phosphatase: 31 U/L — ABNORMAL LOW (ref 38–126)
Anion gap: 7 (ref 5–15)
BUN: 11 mg/dL (ref 6–20)
CO2: 23 mmol/L (ref 22–32)
Calcium: 9 mg/dL (ref 8.9–10.3)
Chloride: 109 mmol/L (ref 98–111)
Creatinine, Ser: 0.69 mg/dL (ref 0.44–1.00)
GFR, Estimated: >60 mL/min (ref >60)
Glucose, Bld: 97 mg/dL (ref 70–99)
Potassium: 4.8 mmol/L (ref 3.5–5.1)
Sodium: 139 mmol/L (ref 135–145)
Total Bilirubin: 0.4 mg/dL (ref <1.2)
Total Protein: 6.8 g/dL (ref 6.5–8.1)

## 2023-07-28 IMAGING — DX DG HIP (WITH OR WITHOUT PELVIS) 2-3V*R*
3 series · 3 of 3 positions shown · non-contrast
Comparison: None.

CLINICAL DATA: Chronic right hip pain.  No known injury.

EXAM:
DG HIP (WITH OR WITHOUT PELVIS) 2-3V RIGHT

[pelvis ap]
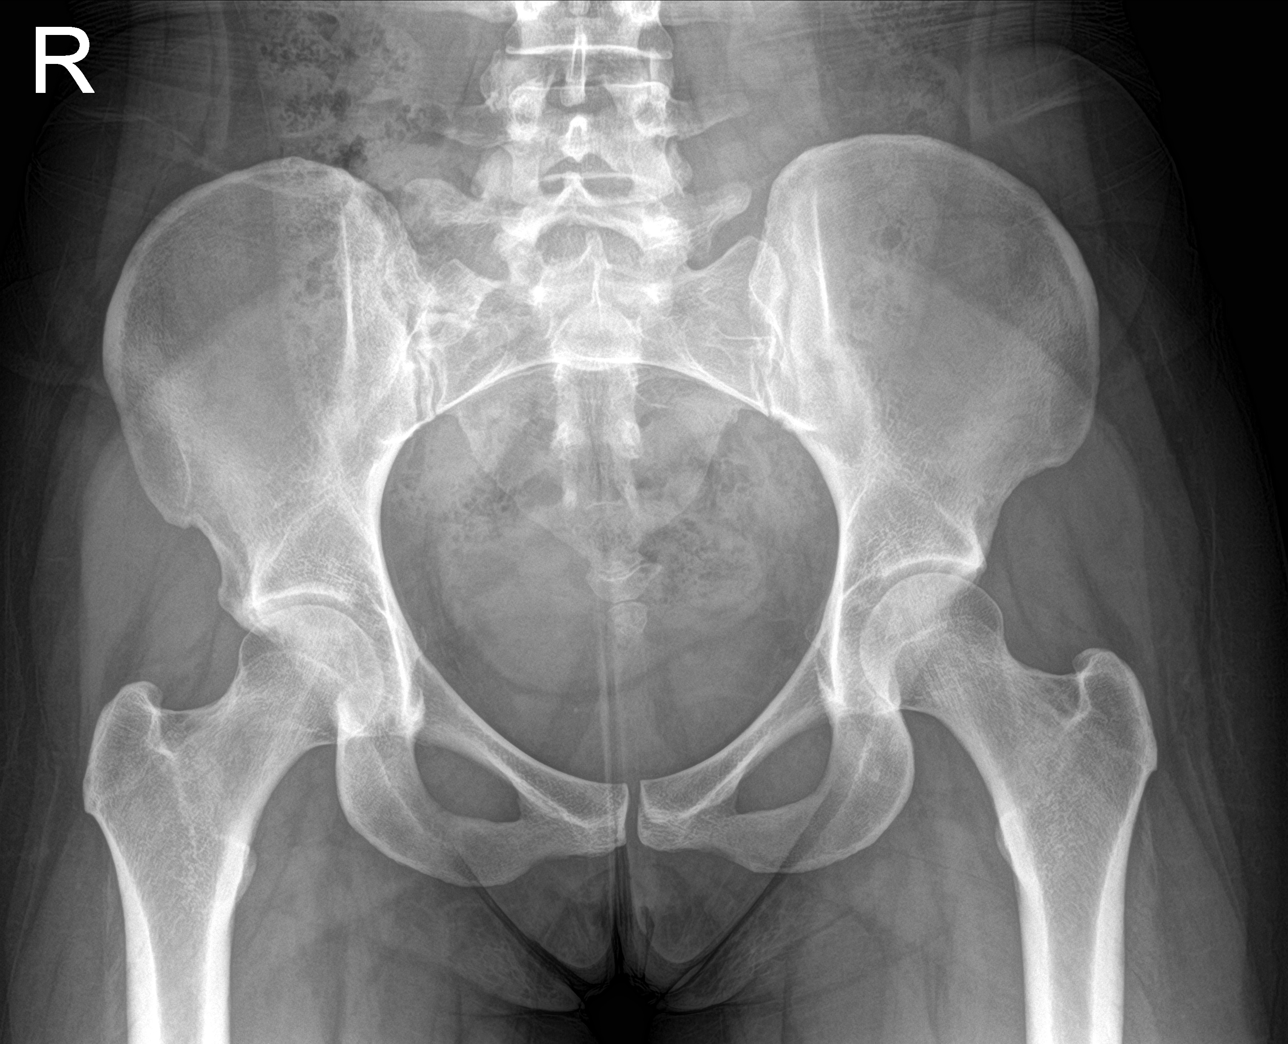

[hip ap]
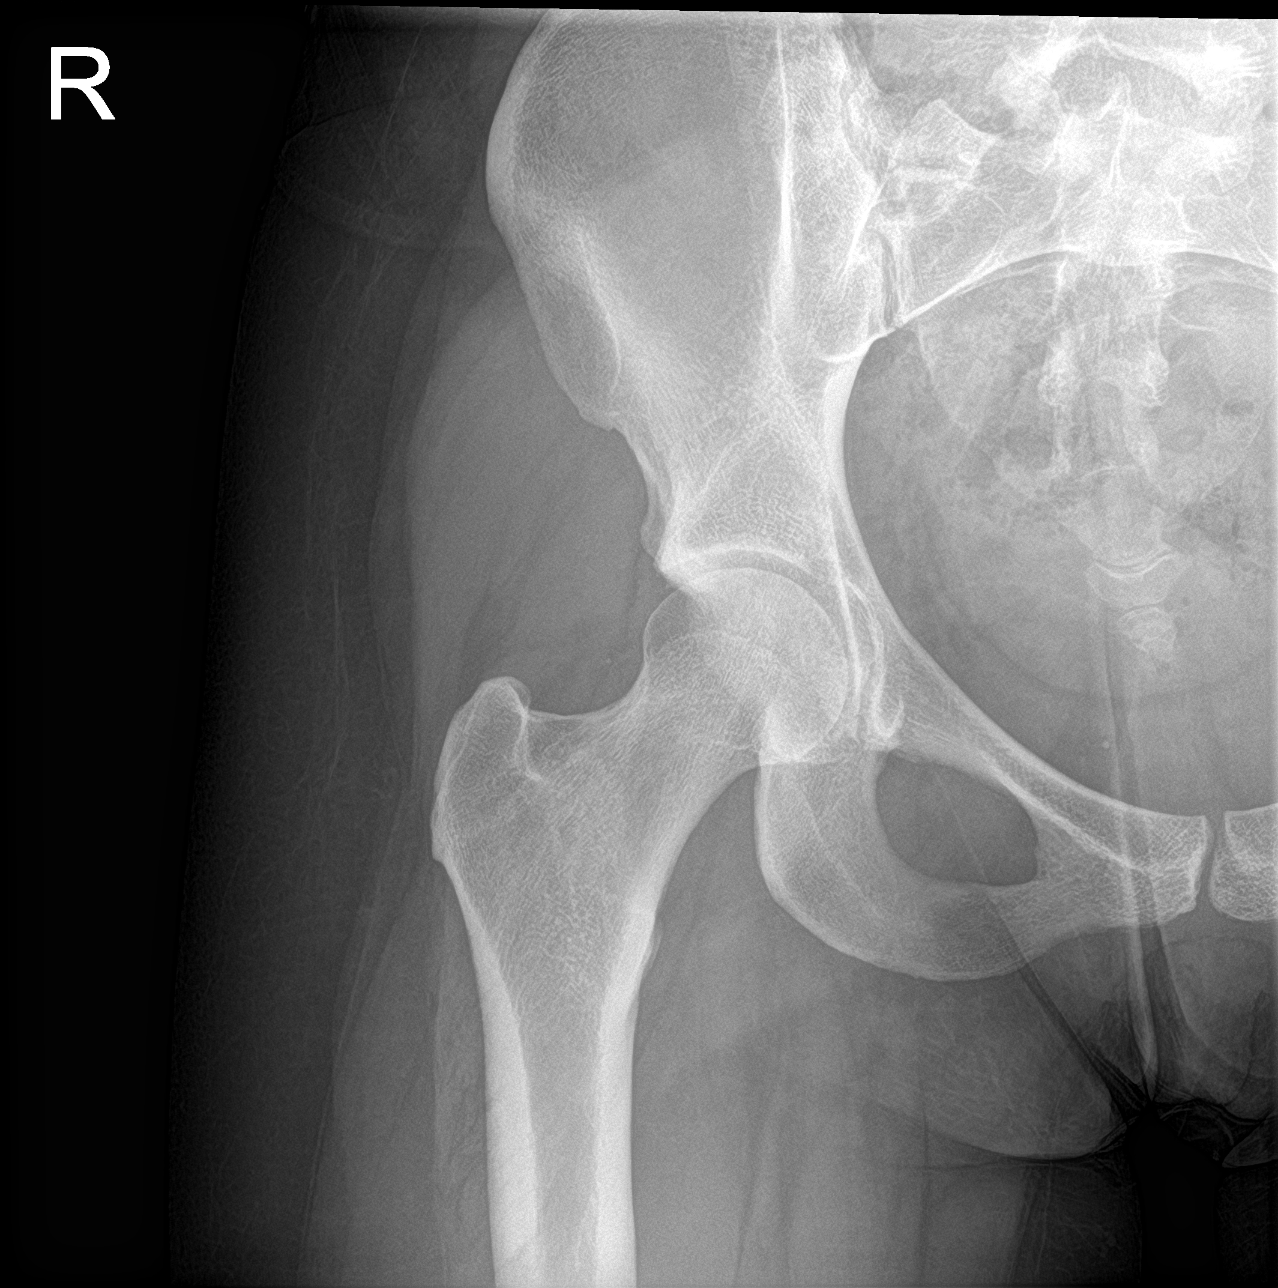

[hip lat]
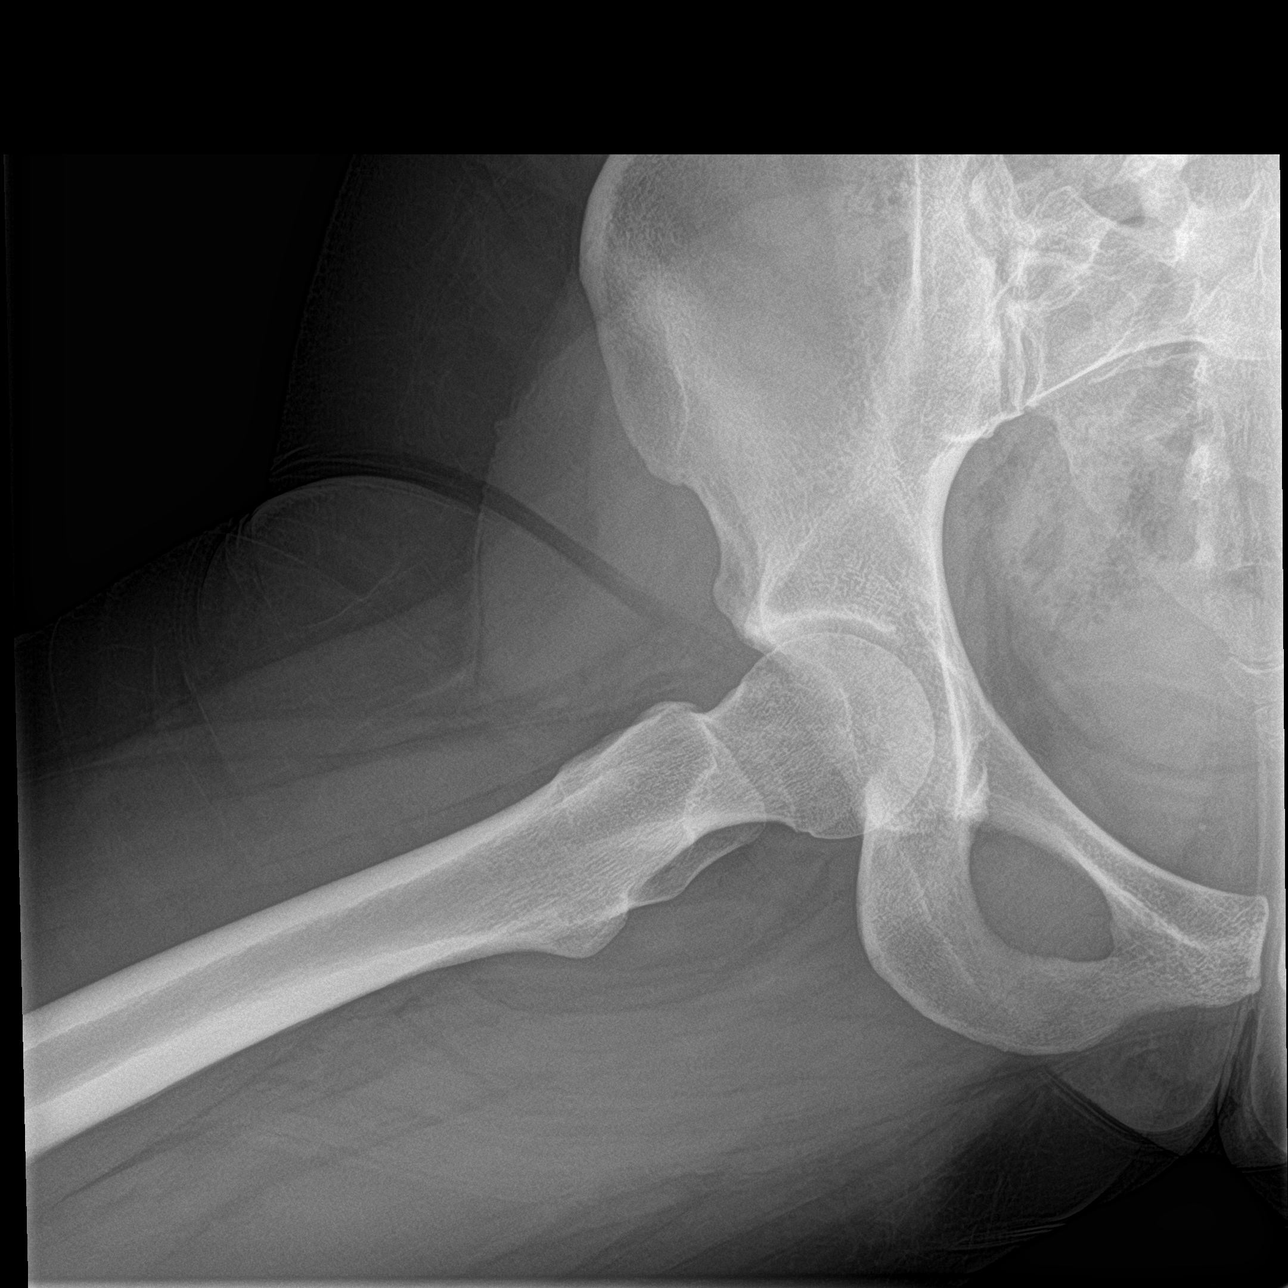

[3 of 3 positions shown; findings below may reference images not displayed]

FINDINGS: Normal bone mineralization. The bilateral sacroiliac common
bilateral femoroacetabular and pubic symphysis joint spaces are
maintained. No acute fracture or dislocation.

There is a right L5-S1 transitional assimilation joint with mild
associated degenerative change.
IMPRESSION: :
IMPRESSION: 1. Normal appearance of the bilateral femoroacetabular joints.
2. There is a normal variant right-sided lumbosacral transitional
assimilation joint with mild associated degenerative change. This
can be associated with right lower back pain and/or right hip pain.

## 2023-08-04 LAB — GENECONNECT MOLECULAR SCREEN: Genetic Analysis Overall Interpretation: NEGATIVE

## 2023-09-15 DIAGNOSIS — M9906 Segmental and somatic dysfunction of lower extremity: Secondary | ICD-10-CM | POA: Diagnosis not present

## 2023-09-15 DIAGNOSIS — M7671 Peroneal tendinitis, right leg: Secondary | ICD-10-CM | POA: Diagnosis not present

## 2023-09-21 DIAGNOSIS — M7671 Peroneal tendinitis, right leg: Secondary | ICD-10-CM | POA: Diagnosis not present

## 2023-09-21 DIAGNOSIS — M9906 Segmental and somatic dysfunction of lower extremity: Secondary | ICD-10-CM | POA: Diagnosis not present

## 2023-10-02 DIAGNOSIS — M9906 Segmental and somatic dysfunction of lower extremity: Secondary | ICD-10-CM | POA: Diagnosis not present

## 2023-10-02 DIAGNOSIS — M7671 Peroneal tendinitis, right leg: Secondary | ICD-10-CM | POA: Diagnosis not present

## 2023-10-08 DIAGNOSIS — M7671 Peroneal tendinitis, right leg: Secondary | ICD-10-CM | POA: Diagnosis not present

## 2023-10-08 DIAGNOSIS — M9906 Segmental and somatic dysfunction of lower extremity: Secondary | ICD-10-CM | POA: Diagnosis not present

## 2023-10-09 ENCOUNTER — Other Ambulatory Visit (HOSPITAL_COMMUNITY): Payer: Self-pay

## 2023-10-09 MED ORDER — ONDANSETRON HCL 4 MG PO TABS
4.0000 mg | ORAL_TABLET | Freq: Two times a day (BID) | ORAL | 1 refills | Status: AC | PRN
  Filled 2023-10-09: qty 30, 15d supply, fill #0

## 2023-10-09 MED FILL — Norgestimate & Ethinyl Estradiol Tab 0.25 MG-35 MCG: ORAL | 84 days supply | Qty: 84 | Fill #1 | Status: CN

## 2023-10-09 MED FILL — Norgestimate & Ethinyl Estradiol Tab 0.25 MG-35 MCG: ORAL | 84 days supply | Qty: 84 | Fill #1 | Status: AC

## 2023-10-23 ENCOUNTER — Ambulatory Visit: Payer: 59

## 2023-10-23 ENCOUNTER — Ambulatory Visit (INDEPENDENT_AMBULATORY_CARE_PROVIDER_SITE_OTHER): Payer: 59 | Admitting: Podiatry

## 2023-10-23 ENCOUNTER — Other Ambulatory Visit: Payer: Self-pay

## 2023-10-23 DIAGNOSIS — M7671 Peroneal tendinitis, right leg: Secondary | ICD-10-CM

## 2023-10-23 DIAGNOSIS — M79671 Pain in right foot: Secondary | ICD-10-CM

## 2023-10-23 MED ORDER — DICLOFENAC SODIUM 75 MG PO TBEC
75.0000 mg | DELAYED_RELEASE_TABLET | Freq: Two times a day (BID) | ORAL | 0 refills | Status: DC
Start: 2023-10-23 — End: 2024-01-19

## 2023-10-23 NOTE — Patient Instructions (Signed)
 Peroneal Tendinopathy Rehab Ask your health care provider which exercises are safe for you. Do exercises exactly as told by your health care provider and adjust them as directed. It is normal to feel mild stretching, pulling, tightness, or discomfort as you do these exercises. Stop right away if you feel sudden pain or your pain gets worse. Do not begin these exercises until told by your health care provider. Stretching and range-of-motion exercises These exercises warm up your muscles and joints. They can help improve the movement and flexibility of your ankle. They may also help to relieve pain and stiffness. Gastrocnemius and soleus stretch, standing This is an exercise in which you stand on a step and use your body weight to stretch your calf muscles. To do this exercise: Stand on the edge of a step on the ball of your left / right foot. The ball of your foot is on the walking surface, right under your toes. Keep your other foot firmly on the same step. Hold on to the wall, a railing, or a chair for balance. Slowly lift your other foot, allowing your body weight to press your left / right heel down over the edge of the step. You should feel a stretch in your left / right calf (gastrocnemius and soleus). Hold this position for __________ seconds. Return both feet to the step. Repeat this exercise with a slight bend in your left / right knee. Repeat __________ times with your left / right knee straight and __________ times with your left / right knee bent. Complete this exercise __________ times a day. Strengthening exercises These exercises build strength and endurance in your foot and ankle. Endurance is the ability to use your muscles for a long time, even after they get tired. Ankle dorsiflexion with band  Secure a rubber exercise band or tube to an object, such as a table leg, that will not move when the band is pulled. Secure the other end of the band around your left / right foot. Sit on  the floor. Face the object with your left / right leg extended. The band or tube should be slightly tense when your foot is relaxed. Slowly flex your left / right ankle and toes to bring your foot toward you (dorsiflexion). Hold this position for __________ seconds. Let the band or tube slowly pull your foot back to the starting position. Repeat __________ times. Complete this exercise __________ times a day. Ankle eversion  Sit on the floor with your legs straight out in front of you. Loop a rubber exercise band or tube around the ball of your left / right foot. The ball of your foot is on the walking surface, right under your toes. Hold the ends of the band in your hands. You can also secure the band to a stable object. The band or tube should be slightly tense when your foot is relaxed. Slowly push your foot outward, away from your other leg (eversion). Hold this position for __________ seconds. Slowly return your foot to the starting position. Repeat __________ times. Complete this exercise __________ times a day. Plantar flexion, standing This exercise is sometimes called a standing heel raise. Stand with your feet shoulder-width apart. Place your hands on a wall or table to steady yourself as needed. Try not to use it for support. Keep your weight spread evenly over the width of your feet while you slowly rise up on your toes (plantar flexion). If told by your health care provider: Shift your weight  toward your left / right leg until you feel challenged. Stand on your left / right leg only. Hold this position for __________ seconds. Repeat __________ times. Complete this exercise __________ times a day. Single leg stand  Without shoes, stand near a railing or in a doorway. You may hold on to the railing or doorframe as needed. Stand on your left / right foot. Keep your big toe down on the floor and try to keep your arch lifted. Do not roll to the outside of your foot. If this  exercise is too easy, you can try it with your eyes closed or while standing on a pillow. Hold this position for __________ seconds. Repeat __________ times. Complete this exercise __________ times a day. This information is not intended to replace advice given to you by your health care provider. Make sure you discuss any questions you have with your health care provider. Document Revised: 12/12/2021 Document Reviewed: 12/12/2021 Elsevier Patient Education  2024 [employer].

## 2023-10-23 NOTE — Progress Notes (Signed)
  Subjective:  Patient ID: Tracy Schneider, female    DOB: Jan 05, 1991,   MRN: [id number]  Chief Complaint  Patient presents with   Foot Pain    Pt presents for pain in the right foot concern for peroneal tendonitis.    33 y.o. female presents for concern of right ankle pain that has been ongoing for about 8 weeks. Relates she did have peroneal tendontis years ago and did improve. She is very active and is training for half marathon. She has been in PT and has been struggling with this so wanted to be seen. Has taken ibuprofen but not much difference . Denies any other pedal complaints. Denies n/v/f/c.   No past medical history on file.  Objective:  Physical Exam: Vascular: DP/PT pulses 2/4 bilateral. CFT <3 seconds. Normal hair growth on digits. No edema.  Skin. No lacerations or abrasions bilateral feet.  Musculoskeletal: MMT 5/5 bilateral lower extremities in DF, PF, Inversion and Eversion. Deceased ROM in DF of ankle joint.  Minimally tender to peroneal tendon in between insertion and distal lateral malleolus. No pain with DF PF or inversion. Some tenderness with eversion Neurological: Sensation intact to light touch.   Assessment:   1. Peroneal tendonitis, right      Plan:  Patient was evaluated and treated and all questions answered. X-rays reviewed and discussed with patient. No acute fractures or dislocations noted. Prominent posterior tubercle of talus.  Discussed peroneal tendinitis and treatment options at length with patient Discussed stretching exercises and provided handout. Prescription for diclofenac  provided. Most recent CMP kidney function wnl.  Dispensed Tri-Lock ankle brace. Discussed that if the symptoms do not improve can consider PT/MRI/ injection Patient to return in 6 to 8 weeks or sooner if symptoms fail to improve or worsen.   Louann Sjogren, DPM

## 2023-10-30 DIAGNOSIS — M9906 Segmental and somatic dysfunction of lower extremity: Secondary | ICD-10-CM | POA: Diagnosis not present

## 2023-10-30 DIAGNOSIS — M7671 Peroneal tendinitis, right leg: Secondary | ICD-10-CM | POA: Diagnosis not present

## 2023-11-06 ENCOUNTER — Other Ambulatory Visit: Payer: Self-pay

## 2023-11-06 ENCOUNTER — Other Ambulatory Visit (HOSPITAL_COMMUNITY): Payer: Self-pay

## 2023-11-06 ENCOUNTER — Encounter: Payer: Self-pay | Admitting: Podiatry

## 2023-11-06 ENCOUNTER — Other Ambulatory Visit: Payer: Self-pay | Admitting: Podiatry

## 2023-11-06 MED ORDER — DICLOFENAC SODIUM 1 % EX GEL
2.0000 g | Freq: Four times a day (QID) | CUTANEOUS | 2 refills | Status: DC
  Filled 2023-11-06: qty 100, 13d supply, fill #0
  Filled 2024-01-04: qty 100, 13d supply, fill #1

## 2023-11-13 DIAGNOSIS — M7671 Peroneal tendinitis, right leg: Secondary | ICD-10-CM | POA: Diagnosis not present

## 2023-11-13 DIAGNOSIS — M9906 Segmental and somatic dysfunction of lower extremity: Secondary | ICD-10-CM | POA: Diagnosis not present

## 2023-12-04 ENCOUNTER — Ambulatory Visit: Payer: 59 | Admitting: Podiatry

## 2023-12-04 DIAGNOSIS — M7671 Peroneal tendinitis, right leg: Secondary | ICD-10-CM | POA: Diagnosis not present

## 2023-12-04 DIAGNOSIS — M9906 Segmental and somatic dysfunction of lower extremity: Secondary | ICD-10-CM | POA: Diagnosis not present

## 2023-12-10 LAB — LAB REPORT - SCANNED
A1c: 4.9
EGFR: 119
Free T4: 9 ng/dL

## 2023-12-18 DIAGNOSIS — M7671 Peroneal tendinitis, right leg: Secondary | ICD-10-CM | POA: Diagnosis not present

## 2023-12-18 DIAGNOSIS — M9906 Segmental and somatic dysfunction of lower extremity: Secondary | ICD-10-CM | POA: Diagnosis not present

## 2024-01-04 ENCOUNTER — Other Ambulatory Visit: Payer: Self-pay

## 2024-01-04 ENCOUNTER — Other Ambulatory Visit (HOSPITAL_COMMUNITY): Payer: Self-pay

## 2024-01-04 DIAGNOSIS — M7671 Peroneal tendinitis, right leg: Secondary | ICD-10-CM | POA: Diagnosis not present

## 2024-01-04 DIAGNOSIS — M9906 Segmental and somatic dysfunction of lower extremity: Secondary | ICD-10-CM | POA: Diagnosis not present

## 2024-01-04 MED FILL — Norgestimate & Ethinyl Estradiol Tab 0.25 MG-35 MCG: ORAL | 28 days supply | Qty: 28 | Fill #2 | Status: CN

## 2024-01-04 MED FILL — Norgestimate & Ethinyl Estradiol Tab 0.25 MG-35 MCG: ORAL | 28 days supply | Qty: 28 | Fill #2 | Status: AC

## 2024-01-19 ENCOUNTER — Ambulatory Visit (INDEPENDENT_AMBULATORY_CARE_PROVIDER_SITE_OTHER): Admitting: Sports Medicine

## 2024-01-19 ENCOUNTER — Other Ambulatory Visit (HOSPITAL_COMMUNITY): Payer: Self-pay

## 2024-01-19 VITALS — BP 111/69 | HR 72 | Resp 20 | Ht 64.0 in | Wt 151.0 lb

## 2024-01-19 DIAGNOSIS — F411 Generalized anxiety disorder: Secondary | ICD-10-CM

## 2024-01-19 DIAGNOSIS — Z0000 Encounter for general adult medical examination without abnormal findings: Secondary | ICD-10-CM

## 2024-01-19 DIAGNOSIS — E785 Hyperlipidemia, unspecified: Secondary | ICD-10-CM | POA: Diagnosis not present

## 2024-01-19 MED ORDER — NORGESTIMATE-ETH ESTRADIOL 0.25-35 MG-MCG PO TABS
1.0000 | ORAL_TABLET | Freq: Every day | ORAL | 3 refills | Status: AC
Start: 2024-01-19 — End: ?
  Filled 2024-01-19 – 2024-02-29 (×2): qty 84, 84d supply, fill #0
  Filled 2024-05-14: qty 84, 84d supply, fill #1
  Filled 2024-07-29: qty 84, 84d supply, fill #2

## 2024-01-19 NOTE — Assessment & Plan Note (Addendum)
 Annual physical as above. Pap was done in the summer 2023. Planning on pregnancy over the next year but refilling birth control for now.

## 2024-01-19 NOTE — Assessment & Plan Note (Signed)
 Under better control with Crestor  5. Teratogenicity not expected with rosuvastatin  based on animal data, and not expected based on limited human data. Risks/benefits can be weighed when the time comes.

## 2024-01-19 NOTE — Assessment & Plan Note (Signed)
 Would like to restart therapy with Tracy Schneider.

## 2024-01-19 NOTE — Progress Notes (Signed)
 Subjective:    CC: Annual Physical Exam  HPI:  This patient is here for their annual physical  I reviewed the past medical history, family history, social history, surgical history, and allergies today and no changes were needed.  Please see the problem list section below in epic for further details.  Past Medical History: History reviewed. No pertinent past medical history. Past Surgical History: Past Surgical History:  Procedure Laterality Date   RHINOPLASTY  10/2005   septorhinoplasty   Social History: Social History   Socioeconomic History   Marital status: Married    Spouse name: Not on file   Number of children: Not on file   Years of education: Not on file   Highest education level: Doctorate  Occupational History    Employer: [employer]  Tobacco Use   Smoking status: Never   Smokeless tobacco: Never  Vaping Use   Vaping status: Never Used  Substance and Sexual Activity   Alcohol use: Not Currently   Drug use: Never   Sexual activity: Yes    Partners: Male    Birth control/protection: Inserts  Other Topics Concern   Not on file  Social History Narrative   Not on file   Social Drivers of Health   Financial Resource Strain: Low Risk  (01/19/2024)   Overall Financial Resource Strain (CARDIA)    Difficulty of Paying Living Expenses: Not hard at all  Food Insecurity: No Food Insecurity (01/19/2024)   Hunger Vital Sign    Worried About Running Out of Food in the Last Year: Never true    Ran Out of Food in the Last Year: Never true  Transportation Needs: No Transportation Needs (01/19/2024)   PRAPARE - Administrator, Civil Service (Medical): No    Lack of Transportation (Non-Medical): No  Physical Activity: Insufficiently Active (01/19/2024)   Exercise Vital Sign    Days of Exercise per Week: 4 days    Minutes of Exercise per Session: 20 min  Stress: No Stress Concern Present (01/19/2024)   [employer] of Occupational Health - Occupational  Stress Questionnaire    Feeling of Stress : Only a little  Social Connections: Moderately Isolated (01/19/2024)   Social Connection and Isolation Panel [NHANES]    Frequency of Communication with Friends and Family: Once a week    Frequency of Social Gatherings with Friends and Family: Once a week    Attends Religious Services: Never    Database administrator or Organizations: Yes    Attends Engineer, structural: More than 4 times per year    Marital Status: Married   Family History: Family History  Problem Relation Age of Onset   High blood pressure Maternal Grandmother    Stroke Paternal Grandmother    Colon cancer Paternal Grandfather    Healthy Mother    Healthy Father    Allergies: No Known Allergies Medications: See med rec.  Review of Systems: No headache, visual changes, nausea, vomiting, diarrhea, constipation, dizziness, abdominal pain, skin rash, fevers, chills, night sweats, swollen lymph nodes, weight loss, chest pain, body aches, joint swelling, muscle aches, shortness of breath, mood changes, visual or auditory hallucinations.  Objective:    General: Well Developed, well nourished, and in no acute distress.  Neuro: Alert and oriented x3, extra-ocular muscles intact, sensation grossly intact. Cranial nerves II through XII are intact, motor, sensory, and coordinative functions are all intact. HEENT: Normocephalic, atraumatic, pupils equal round reactive to light, neck supple, no masses, no  lymphadenopathy, thyroid nonpalpable. Oropharynx, nasopharynx, external ear canals are unremarkable. Skin: Warm and dry, no rashes noted.  Cardiac: Regular rate and rhythm, no murmurs rubs or gallops.  Respiratory: Clear to auscultation bilaterally. Not using accessory muscles, speaking in full sentences.  Abdominal: Soft, nontender, nondistended, positive bowel sounds, no masses, no organomegaly.  Musculoskeletal: Shoulder, elbow, wrist, hip, knee, ankle stable, and with  full range of motion.  Impression and Recommendations:    The patient was counselled, risk factors were discussed, anticipatory guidance given.  Annual physical exam Annual physical as above. Pap was done in the summer 2023. Planning on pregnancy over the next year but refilling birth control for now.   Generalized anxiety disorder Would like to restart therapy with Thersia Flax.  Hyperlipidemia Under better control with Crestor  5. Teratogenicity not expected with rosuvastatin  based on animal data, and not expected based on limited human data. Risks/benefits can be weighed when the time comes.  ____________________________________________ Tracy Schneider. Sandy Crumb, M.D., ABFM., CAQSM., AME. Primary Care and Sports Medicine [facility]  Adjunct Professor of [facility] Medicine  [employer]  Restaurant manager, fast food

## 2024-01-20 ENCOUNTER — Other Ambulatory Visit (HOSPITAL_COMMUNITY): Payer: Self-pay

## 2024-01-20 ENCOUNTER — Encounter: Admitting: Sports Medicine

## 2024-01-22 DIAGNOSIS — M9906 Segmental and somatic dysfunction of lower extremity: Secondary | ICD-10-CM | POA: Diagnosis not present

## 2024-01-22 DIAGNOSIS — M7671 Peroneal tendinitis, right leg: Secondary | ICD-10-CM | POA: Diagnosis not present

## 2024-01-22 DIAGNOSIS — Z01419 Encounter for gynecological examination (general) (routine) without abnormal findings: Secondary | ICD-10-CM | POA: Diagnosis not present

## 2024-01-28 LAB — HM PAP SMEAR

## 2024-02-05 DIAGNOSIS — M9906 Segmental and somatic dysfunction of lower extremity: Secondary | ICD-10-CM | POA: Diagnosis not present

## 2024-02-05 DIAGNOSIS — M7671 Peroneal tendinitis, right leg: Secondary | ICD-10-CM | POA: Diagnosis not present

## 2024-02-10 ENCOUNTER — Encounter: Payer: Self-pay | Admitting: Professional

## 2024-02-10 ENCOUNTER — Ambulatory Visit: Admitting: Professional

## 2024-02-10 DIAGNOSIS — F411 Generalized anxiety disorder: Secondary | ICD-10-CM

## 2024-02-10 NOTE — Progress Notes (Signed)
   Tracy Schneider, [facility]
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
## 2024-02-19 DIAGNOSIS — M9906 Segmental and somatic dysfunction of lower extremity: Secondary | ICD-10-CM | POA: Diagnosis not present

## 2024-02-19 DIAGNOSIS — M7671 Peroneal tendinitis, right leg: Secondary | ICD-10-CM | POA: Diagnosis not present

## 2024-02-29 ENCOUNTER — Other Ambulatory Visit (HOSPITAL_COMMUNITY): Payer: Self-pay

## 2024-02-29 ENCOUNTER — Other Ambulatory Visit: Payer: Self-pay

## 2024-02-29 ENCOUNTER — Other Ambulatory Visit: Payer: Self-pay | Admitting: Sports Medicine

## 2024-02-29 DIAGNOSIS — E785 Hyperlipidemia, unspecified: Secondary | ICD-10-CM

## 2024-02-29 MED ORDER — ROSUVASTATIN CALCIUM 5 MG PO TABS
5.0000 mg | ORAL_TABLET | Freq: Every day | ORAL | 3 refills | Status: DC
  Filled 2024-02-29: qty 90, 90d supply, fill #0

## 2024-03-07 DIAGNOSIS — M9906 Segmental and somatic dysfunction of lower extremity: Secondary | ICD-10-CM | POA: Diagnosis not present

## 2024-03-07 DIAGNOSIS — M7671 Peroneal tendinitis, right leg: Secondary | ICD-10-CM | POA: Diagnosis not present

## 2024-03-11 ENCOUNTER — Encounter: Payer: Self-pay | Admitting: Professional

## 2024-03-11 ENCOUNTER — Ambulatory Visit (INDEPENDENT_AMBULATORY_CARE_PROVIDER_SITE_OTHER): Admitting: Professional

## 2024-03-11 DIAGNOSIS — F411 Generalized anxiety disorder: Secondary | ICD-10-CM

## 2024-03-11 DIAGNOSIS — F4010 Social phobia, unspecified: Secondary | ICD-10-CM
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
## 2024-03-16 NOTE — Addendum Note (Signed)
 Addended by: GEROME ROLLO CROME on: 03/16/2024 12:26 PM   Modules accepted: Level of Service

## 2024-03-18 ENCOUNTER — Ambulatory Visit: Admitting: Professional

## 2024-04-04 DIAGNOSIS — M7671 Peroneal tendinitis, right leg: Secondary | ICD-10-CM | POA: Diagnosis not present

## 2024-04-04 DIAGNOSIS — M9906 Segmental and somatic dysfunction of lower extremity: Secondary | ICD-10-CM | POA: Diagnosis not present

## 2024-04-15 ENCOUNTER — Ambulatory Visit: Admitting: Professional

## 2024-04-18 ENCOUNTER — Encounter: Payer: Self-pay | Admitting: Professional

## 2024-04-18 ENCOUNTER — Ambulatory Visit (INDEPENDENT_AMBULATORY_CARE_PROVIDER_SITE_OTHER): Admitting: Professional

## 2024-04-18 DIAGNOSIS — F411 Generalized anxiety disorder: Secondary | ICD-10-CM | POA: Diagnosis not present
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
## 2024-05-03 ENCOUNTER — Encounter: Payer: Self-pay | Admitting: Sports Medicine

## 2024-05-03 DIAGNOSIS — L814 Other melanin hyperpigmentation: Secondary | ICD-10-CM | POA: Diagnosis not present

## 2024-05-03 DIAGNOSIS — L811 Chloasma: Secondary | ICD-10-CM | POA: Diagnosis not present

## 2024-05-03 DIAGNOSIS — D225 Melanocytic nevi of trunk: Secondary | ICD-10-CM | POA: Diagnosis not present

## 2024-05-11 DIAGNOSIS — M7671 Peroneal tendinitis, right leg: Secondary | ICD-10-CM | POA: Diagnosis not present

## 2024-05-11 DIAGNOSIS — M9906 Segmental and somatic dysfunction of lower extremity: Secondary | ICD-10-CM | POA: Diagnosis not present

## 2024-05-13 ENCOUNTER — Encounter: Payer: Self-pay | Admitting: Professional

## 2024-05-13 ENCOUNTER — Ambulatory Visit (INDEPENDENT_AMBULATORY_CARE_PROVIDER_SITE_OTHER): Admitting: Professional

## 2024-05-13 DIAGNOSIS — F411 Generalized anxiety disorder: Secondary | ICD-10-CM

## 2024-05-13 NOTE — Progress Notes (Signed)
 [facility] Counselor/Therapist Progress Note  Patient ID: Tracy Schneider, MRN: [id number],    Date: 05/13/2024  Time Spent: 43 minutes 901-944am  Treatment Type: Individual Therapy  Risk Assessment: Danger to Self:  No Self-injurious Behavior: No Danger to Others: No  Subjective: This session was held via video teletherapy. The patient consented to video teletherapy and was located in her home during this session. She is aware it is the responsibility of the patient to secure confidentiality on her end of the session. The provider was in a private home office for the duration of this session.    The patient arrived on time for the Caregility appointment.  Issues addressed: 1-screen time -decrease screen time by setting a timer -pt reports daily average down to 1 hr Instagram and was over 2 hrs -noticed it's the only way she consumes news -has started listening to [employer] in the 4 minute segments -take the app off the home screen -in the downtime she will go and try to find and have to go to several different clicks -she opens and then leaves open and now has to work on closing the app -she has several good books she is reading -is it anxious seeking or information seeking 2-state of the country are getting worse -pt feels impacted because there is bad thing after bad thing -does not know how to compartmentalize -notices some anxiety -coping with everything  -what is the escape valve  Treatment Plan Problems: Anxiety, Childbearing/Rearing Decisions, Social Anxiety Symptoms: Hypervigilance (e.g., feeling constantly on edge, experiencing concentration difficulties, having trouble falling or staying asleep, exhibiting a general state of irritability). Excessive and/or unrealistic worry that is difficult to control occurring more days than not for at least 6 months about a number of events or activities. Hypersensitivity to the criticism or disapproval of  others. Reluctant involvement in social situations out of fear of saying or doing something foolish or of becoming emotional in front of others. Struggles with the decision whether and/or when to have (or forego) children based on issues such as infertility, career advancement, financial resources, personal values, and religious beliefs. Goals: Stabilize anxiety level while increasing ability to function on a daily basis. Interact socially without undue fear or anxiety. Develop the essential social skills that will enhance the quality of relationship life. Develop the ability to form relationships that will enhance recovery support system. Develop an effective decision-making approach to address whether and when to bear/raise children. Resolve conflict with partner regarding reproductive goals and the decision to have children. Objectives target date for all objectives is 03/11/2025: Maintain involvement in work, family, and social activities. Learn to accept limitations in life and commit to tolerating, rather than avoiding, unpleasant emotions while accomplishing meaningful goals. Learn and implement relapse prevention strategies for managing possible future anxiety symptoms. Identify and engage in pleasant activities on a daily basis. Learn and implement calming and coping strategies to manage anxiety symptoms during moments of social anxiety and lead to a more relaxed state in general. Verbalize and demonstrate an understanding and resolution of current interpersonal problems. Increase effective communication with partner regarding childbearing/rearing decisions. Identify at least two core themes related to conflict with partner regarding childbearing/rearing issue and overtly communicate about them. Verbalize and reframe negative self-talk about childbearing decisions. Verbalize anxieties related to childbearing decisions. Describe the internal and external pressures associated with  childbearing decisions. Interventions: Teach and ask the client to practice relaxation and attentional focusing skills (e.g., staying focused externally and on behavioral goals,  muscular relaxation, evenly paced diaphragmatic breathing, ride the wave of anxiety) for managing social anxiety symptoms and maintaining a more relaxed approach to life; review, reinforce successes; provide corrective feedback toward effective use. For interpersonal deficits, help the client develop new interpersonal skills and relationships. For interpersonal disputes, help the client explore the relationship, the nature of the dispute, whether it has reached an impasse, and available options to resolve it including learning and implementing conflict-resolution skills; if the relationship has reached an impasse, consider ways to change the impasse or to end the relationship. Engage the client in behavioral activation, increasing the client's contact with sources of reward, identifying processes that inhibit activation, and teaching skills to solve life problems (or assign Identify and Schedule Pleasant Activities in the Adult Psychotherapy Homework Planner by Jenniffer); use behavioral techniques such as instruction, rehearsal, role-playing, role reversal as needed to assist adoption into the client's daily life; reinforce success. Schedule periodic maintenance sessions to help the client maintain therapeutic gains. Support the client in following through with work, family, and social activities rather than escaping or avoiding them to focus on anxiety. Use techniques from Acceptance and Commitment Therapy to help client accept uncomfortable realities such as lack of complete control, imperfections, and uncertainty and tolerate unpleasant emotions and thoughts in order to accomplish value-consistent goals. Reinforce the client's personal wants and needs with regard to childbearing. Explore the client's images of motherhood and  the degree to which she believes she meets these ideals; discuss discrepancies between realistic and ideal images and resultant anxieties. Assist the client with exploring and challenging unrealistic perceptions and fears related to being the perfect mother. Assist the client in replacing negative/counterproductive coping strategies with positive/productive coping strategies; encourage those that lead to psychological and physical well-being (e.g., replace smoking with exercising). Explore with the client possible solutions to role conflict and overload (e.g., postpone having children; speak with boss/employer about flextime, time off, job sharing; hire a babysitter to assist with childcare so that she can complete school assignments).  Diagnosis:Generalized anxiety disorder  Plan:   -meet again on Friday, June 10, 2024 at [email or web address].

## 2024-05-14 ENCOUNTER — Other Ambulatory Visit (HOSPITAL_COMMUNITY): Payer: Self-pay

## 2024-05-18 ENCOUNTER — Other Ambulatory Visit (HOSPITAL_COMMUNITY): Payer: Self-pay

## 2024-06-02 DIAGNOSIS — H1045 Other chronic allergic conjunctivitis: Secondary | ICD-10-CM | POA: Diagnosis not present

## 2024-06-06 DIAGNOSIS — M7671 Peroneal tendinitis, right leg: Secondary | ICD-10-CM | POA: Diagnosis not present

## 2024-06-06 DIAGNOSIS — M9906 Segmental and somatic dysfunction of lower extremity: Secondary | ICD-10-CM | POA: Diagnosis not present

## 2024-06-10 ENCOUNTER — Ambulatory Visit: Admitting: Professional

## 2024-06-10 ENCOUNTER — Encounter: Payer: Self-pay | Admitting: Professional

## 2024-06-10 DIAGNOSIS — F411 Generalized anxiety disorder: Secondary | ICD-10-CM
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
## 2024-06-13 ENCOUNTER — Ambulatory Visit: Payer: Self-pay | Admitting: *Deleted

## 2024-06-13 NOTE — Telephone Encounter (Signed)
 Patient scheduled.

## 2024-06-13 NOTE — Telephone Encounter (Signed)
 FYI Only or Action Required?: FYI only for provider.  Patient was last seen in primary care on 01/19/2024 by Curtis Debby PARAS, MD.  Called Nurse Triage reporting Numbness.  Symptoms began several months ago.  Interventions attempted: Rest, hydration, or home remedies.  Symptoms are: unchanged.  Triage Disposition: See PCP When Office is Open (Within 3 Days)  Patient/caregiver understands and will follow disposition?: Yes   Acute appt scheduled for tomorrow. TOC  new provider appt scheduled for 08/05/24.              Copied from CRM [id number]. Topic: Clinical - Red Word Triage >> Jun 13, 2024  1:26 PM Emylou G wrote: [facility] that prompted transfer to Nurse Triage: numbness, tingling, w/muscle spasms Reason for Disposition  [1] Numbness or tingling in one or both hands AND [2] is a chronic symptom (recurrent or ongoing AND present > 4 weeks)  Answer Assessment - Initial Assessment Questions Requesting appt for N/T in fingertips and muscle twitching. Scheduled tomorrow with provider. Scheduled TOC for new patient appt for 08/05/24.   Recommended if sx worsen go to UC/ED for evaluation   1. SYMPTOM: What is the main symptom you are concerned about? (e.g., weakness, numbness)     Numbness tingling in fingertips, muscle twitching random times different areas of body  2. ONSET: When did this start? (e.g., minutes, hours, days; while sleeping)     On going more than a month 3. LAST NORMAL: When was the last time you (the patient) were normal (no symptoms)?     Na  4. PATTERN Does this come and go, or has it been constant since it started?  Is it present now?     Comes and goes 5. CARDIAC SYMPTOMS: Have you had any of the following symptoms: chest pain, difficulty breathing, palpitations?     No  6. NEUROLOGIC SYMPTOMS: Have you had any of the following symptoms: headache, dizziness, vision loss, double vision, changes in speech, unsteady on your feet?      No headache no dizziness, no weakness on either side of body. N/T in fingertips has been seen for issue before. Muscle twitching different areas of body at times lasting few seconds to few minutes. 7. OTHER SYMPTOMS: Do you have any other symptoms?     No  8. PREGNANCY: Is there any chance you are pregnant? When was your last menstrual period?     Na  Protocols used: Neurologic Deficit-A-AH

## 2024-06-14 ENCOUNTER — Encounter: Payer: Self-pay | Admitting: Urgent Care

## 2024-06-14 ENCOUNTER — Ambulatory Visit: Admitting: Urgent Care

## 2024-06-14 VITALS — BP 121/82 | HR 84 | Ht 64.0 in | Wt 148.0 lb

## 2024-06-14 DIAGNOSIS — E663 Overweight: Secondary | ICD-10-CM

## 2024-06-14 DIAGNOSIS — G629 Polyneuropathy, unspecified: Secondary | ICD-10-CM

## 2024-06-14 DIAGNOSIS — Z23 Encounter for immunization: Secondary | ICD-10-CM | POA: Diagnosis not present

## 2024-06-14 DIAGNOSIS — F411 Generalized anxiety disorder: Secondary | ICD-10-CM | POA: Diagnosis not present

## 2024-06-14 DIAGNOSIS — R253 Fasciculation: Secondary | ICD-10-CM

## 2024-06-14 NOTE — Patient Instructions (Signed)
 We updated your flu and covid vaccine today.  We drew labs to assess your symptoms.  Please STOP your crestor  to see if this helps.  Consider OTC fish oil and rosveretrol for natural cholesterol reduction: Dietary Recommendations for Elevated LDL Cholesterol  Elevated low-density lipoprotein (LDL) cholesterol is a significant risk factor for cardiovascular disease. The following dietary guidelines aim to help lower LDL levels and support overall heart health.  Foods to Emphasize 1. High-Fiber Foods Soluble fiber can reduce LDL cholesterol by binding to it in the digestive tract. Aim for at least 10-25 grams of soluble fiber per day.  Examples: Oats and oat bran Barley Legumes (beans, lentils, chickpeas) Apples, oranges, pears, and berries Carrots, Brussels sprouts  2. Healthy Fats Replace saturated and trans fats with unsaturated fats. Monounsaturated fats: olive oil, avocado, nuts (almonds, peanuts) Polyunsaturated fats: fatty fish (salmon, sardines), walnuts, flaxseeds, sunflower seeds  3. Plant Sterols and Stanols Naturally found in small amounts in plant-based foods. Also available in fortified products like certain margarines, orange juice, or yogurts. Aim for 2 grams per day to help lower LDL.  4. Whole Grains Choose whole grains over refined grains. Examples: brown rice, quinoa, whole wheat, bulgur, farro  5. Fruits and Vegetables Aim for at least 5 servings per day. Provide fiber, antioxidants, and nutrients that support cardiovascular health.  6. Lean Protein Sources Skinless poultry, fish, legumes, tofu Include fatty fish (like salmon, tuna, or sardines) at least twice per week for omega-3s.  Foods to Limit or Avoid 1. Saturated Fats Limit to less than 5-6% of total daily calories. Sources to avoid or limit: Fatty cuts of red meat Full-fat dairy (butter, cheese, cream) Coconut oil, palm oil Processed meats (bacon, sausage)  2. Trans Fats Avoid  entirely. Often found in: Processed baked goods (cookies, pastries, crackers) Fried fast foods Margarine or shortening (check labels for partially hydrogenated oils)  3. Dietary Cholesterol While less impactful than saturated/trans fats, still best to limit. Found in egg yolks, shellfish, and organ meats. Limit to <200 mg per day if LDL is high and you're at cardiovascular risk.  4. Added Sugars and Refined Carbs Limit sugary drinks, desserts, white bread, and processed snacks. These can contribute to poor lipid profiles and weight gain.

## 2024-06-14 NOTE — Progress Notes (Unsigned)
   Established Patient Office Visit  Subjective:  Patient ID: Kaleyah Labreck, female    DOB: [birth year]  Age: 33 y.o. MRN: [id number]  Chief Complaint  Patient presents with   Hand Pain    Numb/tingle in fingers and toes with muscle twitching all over body at times    HPI  {History (Optional):23778}  ROS: as noted in HPI  Objective:     BP 121/82   Pulse 84   Ht 5' 4 (1.626 m)   Wt 148 lb (67.1 kg)   SpO2 100%   BMI 25.40 kg/m  {Vitals History (Optional):23777}  Physical Exam   No results found for any visits on 06/14/24.  {Labs (Optional):23779}  The ASCVD Risk score (Arnett DK, et al., 2019) failed to calculate for the following reasons:   The 2019 ASCVD risk score is only valid for ages 81 to 60  Assessment & Plan:  Immunization due -     Flu vaccine trivalent PF, 6mos and older(Flulaval ,Afluria,Fluarix ,Fluzone)  Frequent fasciculation of muscle of extremity -     Vitamin B6 -     CBC -     Comprehensive metabolic panel with GFR -     Methylmalonic acid, serum -     TSH -     Vitamin B1 -     Vitamin B12 -     Zinc -     Homocysteine -     Hemoglobin A1c -     Magnesium  Polyneuropathy -     Vitamin B6 -     CBC -     Comprehensive metabolic panel with GFR -     Methylmalonic acid, serum -     TSH -     Vitamin B1 -     Vitamin B12 -     Zinc -     Homocysteine -     Hemoglobin A1c -     Magnesium  Generalized anxiety disorder  Overweight (BMI 25.0-29.9)     No follow-ups on file.   Benton LITTIE Gave, PA

## 2024-06-15 ENCOUNTER — Ambulatory Visit: Payer: Self-pay | Admitting: Urgent Care

## 2024-06-15 ENCOUNTER — Encounter: Payer: Self-pay | Admitting: Urgent Care

## 2024-06-16 DIAGNOSIS — H1045 Other chronic allergic conjunctivitis: Secondary | ICD-10-CM | POA: Diagnosis not present

## 2024-06-21 LAB — COMPREHENSIVE METABOLIC PANEL WITH GFR
ALT: 12 IU/L (ref 0–32)
AST: 13 IU/L (ref 0–40)
Albumin: 4.6 g/dL (ref 3.9–4.9)
Alkaline Phosphatase: 39 IU/L — ABNORMAL LOW (ref 41–116)
BUN/Creatinine Ratio: 22 (ref 9–23)
BUN: 16 mg/dL (ref 6–20)
Bilirubin Total: 0.4 mg/dL (ref 0.0–1.2)
CO2: 22 mmol/L (ref 20–29)
Calcium: 9.4 mg/dL (ref 8.7–10.2)
Chloride: 104 mmol/L (ref 96–106)
Creatinine, Ser: 0.74 mg/dL (ref 0.57–1.00)
Globulin, Total: 2.5 g/dL (ref 1.5–4.5)
Glucose: 73 mg/dL (ref 70–99)
Potassium: 4.3 mmol/L (ref 3.5–5.2)
Sodium: 142 mmol/L (ref 134–144)
Total Protein: 7.1 g/dL (ref 6.0–8.5)
eGFR: 111 mL/min/1.73 (ref >59)

## 2024-06-21 LAB — HEMOGLOBIN A1C
Est. average glucose Bld gHb Est-mCnc: 97 mg/dL
Hgb A1c MFr Bld: 5 % (ref 4.8–5.6)

## 2024-06-21 LAB — MAGNESIUM: Magnesium: 2 mg/dL (ref 1.6–2.3)

## 2024-06-21 LAB — METHYLMALONIC ACID, SERUM: Methylmalonic Acid: 126 nmol/L (ref 0–378)

## 2024-06-21 LAB — CBC
Hematocrit: 42.2 % (ref 34.0–46.6)
Hemoglobin: 13.2 g/dL (ref 11.1–15.9)
MCH: 29.3 pg (ref 26.6–33.0)
MCHC: 31.3 g/dL — ABNORMAL LOW (ref 31.5–35.7)
MCV: 94 fL (ref 79–97)
Platelets: 196 x10E3/uL (ref 150–450)
RBC: 4.51 x10E6/uL (ref 3.77–5.28)
RDW: 12.9 % (ref 11.7–15.4)
WBC: 6.2 x10E3/uL (ref 3.4–10.8)

## 2024-06-21 LAB — VITAMIN B1: Thiamine: 112.9 nmol/L (ref 66.5–200.0)

## 2024-06-21 LAB — TSH: TSH: 1.64 u[IU]/mL (ref 0.450–4.500)

## 2024-06-21 LAB — ZINC: Zinc: 63 ug/dL (ref 44–115)

## 2024-06-21 LAB — VITAMIN B6: Vitamin B6: 31.9 ug/L (ref 3.4–65.2)

## 2024-06-21 LAB — HOMOCYSTEINE: Homocysteine: 6.1 umol/L (ref 0.0–14.5)

## 2024-06-21 LAB — VITAMIN B12: Vitamin B-12: 673 pg/mL (ref 232–1245)

## 2024-07-08 ENCOUNTER — Ambulatory Visit: Admitting: Professional

## 2024-07-27 DIAGNOSIS — M7671 Peroneal tendinitis, right leg: Secondary | ICD-10-CM | POA: Diagnosis not present

## 2024-07-27 DIAGNOSIS — M9906 Segmental and somatic dysfunction of lower extremity: Secondary | ICD-10-CM | POA: Diagnosis not present

## 2024-07-29 ENCOUNTER — Other Ambulatory Visit (HOSPITAL_COMMUNITY): Payer: Self-pay

## 2024-08-05 ENCOUNTER — Other Ambulatory Visit (HOSPITAL_COMMUNITY): Payer: Self-pay

## 2024-08-05 ENCOUNTER — Ambulatory Visit: Admitting: Urgent Care

## 2024-08-05 ENCOUNTER — Encounter: Payer: Self-pay | Admitting: Urgent Care

## 2024-08-05 ENCOUNTER — Ambulatory Visit: Admitting: Professional

## 2024-08-05 ENCOUNTER — Other Ambulatory Visit: Payer: Self-pay

## 2024-08-05 VITALS — BP 122/81 | HR 67 | Ht 64.0 in | Wt 148.0 lb

## 2024-08-05 DIAGNOSIS — M79602 Pain in left arm: Secondary | ICD-10-CM

## 2024-08-05 DIAGNOSIS — E785 Hyperlipidemia, unspecified: Secondary | ICD-10-CM | POA: Diagnosis not present

## 2024-08-05 DIAGNOSIS — R253 Fasciculation: Secondary | ICD-10-CM | POA: Diagnosis not present

## 2024-08-05 DIAGNOSIS — R202 Paresthesia of skin: Secondary | ICD-10-CM | POA: Diagnosis not present

## 2024-08-05 DIAGNOSIS — M79601 Pain in right arm: Secondary | ICD-10-CM | POA: Diagnosis not present

## 2024-08-05 DIAGNOSIS — M7521 Bicipital tendinitis, right shoulder: Secondary | ICD-10-CM

## 2024-08-05 MED ORDER — DICLOFENAC SODIUM 1 % EX GEL
2.0000 g | Freq: Four times a day (QID) | CUTANEOUS | 2 refills | Status: AC
  Filled 2024-08-05 – 2024-08-16 (×2): qty 100, 13d supply, fill #0

## 2024-08-05 NOTE — Progress Notes (Signed)
 Established Patient Office Visit  Subjective:  Patient ID: Tracy Schneider, female    DOB: 1990/09/22  Age: 33 y.o. MRN: [id number]  Chief Complaint  Patient presents with   Establish Care    Follow up on hand twitching; right shoulder pain/injury    HPI  Discussed the use of AI scribe software for clinical note transcription with the patient, who gave verbal consent to proceed.  History of Present Illness   Tracy Schneider is a 33 year old female who presents for an establishing care visit and to discuss past medical issues.  She has a history of elevated LDL cholesterol, previously measured at 165 mg/dL. She had been taking Crestor  for over a year but discontinued it due to muscle fasciculations and numbness. These symptoms have decreased since stopping the medication. Her last cholesterol profile was over a year ago, and she had stopped Crestor  due to tendinitis in her foot. She is considering alternative treatments like fish oil and resveratrol to manage her cholesterol levels.  She experiences muscle fasciculations and numbness, primarily in her finger pads and toes, described as a 'stocking glove type' sensation. The fasciculations are random and occur all over. No back pain, neck stiffness, vision changes, speech difficulties, or gait abnormalities. She is not currently taking magnesium supplements.  She reports shoulder pain following a game of Ultimate Frisbee two months ago, which prevents her from sleeping on that side and causes sharp pain with certain movements. She has a history of a bicep tendon tear and tendinitis, and reports that an MRI was performed for further evaluation. She has not been using any pain medication recently, as NSAIDs were not effective previously.  She plays Ultimate Frisbee, which is a throwing sport, and her dominant hand is involved.      Patient Active Problem List   Diagnosis Date Noted   Hyperlipidemia 01/13/2023   Annual physical exam 12/20/2021    Chronic right hip pain 12/20/2021   Generalized anxiety disorder 09/13/2021   Skin lesion 09/04/2021   Chronic right shoulder pain 04/09/2021   Chronic right hamstring pain 04/09/2021   Past Medical History:  Diagnosis Date   Anxiety    Hyperlipidemia    Past Surgical History:  Procedure Laterality Date   RHINOPLASTY  10/2005   septorhinoplasty   Social History   Tobacco Use   Smoking status: Never   Smokeless tobacco: Never  Vaping Use   Vaping status: Never Used  Substance Use Topics   Alcohol use: Not Currently   Drug use: Never      ROS: as noted in HPI  Objective:     BP 122/81   Pulse 67   Ht 5' 4 (1.626 m)   Wt 148 lb (67.1 kg)   SpO2 100%   BMI 25.40 kg/m  BP Readings from Last 3 Encounters:  08/05/24 122/81  06/14/24 121/82  01/19/24 111/69   Wt Readings from Last 3 Encounters:  08/05/24 148 lb (67.1 kg)  06/14/24 148 lb (67.1 kg)  01/19/24 151 lb (68.5 kg)      Physical Exam Vitals and nursing note reviewed.  Constitutional:      General: She is not in acute distress.    Appearance: Normal appearance. She is not ill-appearing, toxic-appearing or diaphoretic.  HENT:     Head: Normocephalic and atraumatic.  Eyes:     General: No scleral icterus.       Right eye: No discharge.        Left eye:  No discharge.     Extraocular Movements: Extraocular movements intact.     Pupils: Pupils are equal, round, and reactive to light.  Neck:     Comments: Negative Lehrmittes sign Cardiovascular:     Rate and Rhythm: Normal rate.  Pulmonary:     Effort: Pulmonary effort is normal. No respiratory distress.  Musculoskeletal:        General: Tenderness (over long head biceps tendon on R) present. No swelling, deformity or signs of injury. Normal range of motion.     Cervical back: Normal range of motion. No rigidity or tenderness.     Comments: Negative speed, yergason, obrien, hawkins, neer and sulcus sign  Lymphadenopathy:     Cervical: No  cervical adenopathy.  Skin:    General: Skin is warm and dry.     Coloration: Skin is not jaundiced.     Findings: No bruising, erythema or rash.  Neurological:     General: No focal deficit present.     Mental Status: She is alert and oriented to person, place, and time.     Gait: Gait normal.  Psychiatric:        Mood and Affect: Mood normal.        Behavior: Behavior normal.      No results found for any visits on 08/05/24.  Last CBC Lab Results  Component Value Date   WBC 6.2 06/14/2024   HGB 13.2 06/14/2024   HCT 42.2 06/14/2024   MCV 94 06/14/2024   MCH 29.3 06/14/2024   RDW 12.9 06/14/2024   PLT 196 06/14/2024   Last metabolic panel Lab Results  Component Value Date   GLUCOSE 73 06/14/2024   NA 142 06/14/2024   K 4.3 06/14/2024   CL 104 06/14/2024   CO2 22 06/14/2024   BUN 16 06/14/2024   CREATININE 0.74 06/14/2024   EGFR 111 06/14/2024   CALCIUM  9.4 06/14/2024   PROT 7.1 06/14/2024   ALBUMIN 4.6 06/14/2024   LABGLOB 2.5 06/14/2024   BILITOT 0.4 06/14/2024   ALKPHOS 39 (L) 06/14/2024   AST 13 06/14/2024   ALT 12 06/14/2024   ANIONGAP 7 07/21/2023   Last lipids Lab Results  Component Value Date   CHOL 182 07/21/2023   HDL 50 07/21/2023   LDLCALC 115 (H) 07/21/2023   TRIG 85 07/21/2023   CHOLHDL 3.6 07/21/2023   Last hemoglobin A1c Lab Results  Component Value Date   HGBA1C 5.0 06/14/2024   Last thyroid functions Lab Results  Component Value Date   TSH 1.640 06/14/2024   FREET4 9.0 12/10/2023   Last vitamin D No results found for: 25OHVITD2, 25OHVITD3, VD25OH Last vitamin B12 and Folate Lab Results  Component Value Date   VITAMINB12 673 06/14/2024      The ASCVD Risk score (Arnett DK, et al., 2019) failed to calculate for the following reasons:   The 2019 ASCVD risk score is only valid for ages 14 to 88  Assessment & Plan:  Biceps tendonitis on right -     Diclofenac  Sodium; Apply 2 g topically 4 (four) times daily. Rub  into affected area of foot 2 to 4 times daily  Dispense: 100 g; Refill: 2  Hyperlipidemia, unspecified hyperlipidemia type  Frequent fasciculation of muscle of extremity  Paresthesia and pain of both upper extremities  Assessment and Plan    Right biceps tendinopathy with partial tear Chronic tendinopathy with partial tear, exacerbated by activity. MRI showed minimal supraspinatus tendinosis and mild biceps tendinosis with a  longitudinal split tear. No surgery needed. NSAIDs ineffective. - Provided home rehabilitation exercises. - Refilled Voltaren  gel for pain management. - Discussed compounded topical formulations if needed. - Consider physical therapy if symptoms persist.  Hyperlipidemia LDL previously around 150 mg/dL. Crestor  discontinued due to side effects. Discussed resveratrol and fish oil as alternatives. Repatha considered if no improvement. - Continue resveratrol and fish oil for three months. - Reassess lipid levels in three months. - Consider Repatha if no improvement.  Muscle fasciculations and numbness Symptoms decreased after stopping Crestor . Differential includes stocking-glove neuropathy. No evidence of MS. - Monitor symptoms, consider neurology referral if persistent. - Consider EMG or brain MRI if further evaluation needed.         No follow-ups on file.   Benton LITTIE Gave, PA

## 2024-08-05 NOTE — Patient Instructions (Addendum)
 Please continue with topical voltaren  gel to your shoulder.  If this becomes ineffective, let me know and we can make a custom compound.  Please do the exercises attached to this form. If you need a referral to PT let me know. We can also consider a neuro referral.  Please take the OTC fish oil and reseveratrol. We will redraw your fasting lipids again in about 3 months.

## 2024-08-08 ENCOUNTER — Other Ambulatory Visit: Payer: Self-pay

## 2024-08-11 ENCOUNTER — Other Ambulatory Visit: Payer: Self-pay

## 2024-08-15 ENCOUNTER — Encounter: Payer: Self-pay | Admitting: Urgent Care

## 2024-08-16 ENCOUNTER — Ambulatory Visit: Admitting: Professional

## 2024-08-16 ENCOUNTER — Encounter: Payer: Self-pay | Admitting: Professional

## 2024-08-16 ENCOUNTER — Other Ambulatory Visit (HOSPITAL_COMMUNITY): Payer: Self-pay

## 2024-08-16 DIAGNOSIS — F411 Generalized anxiety disorder: Secondary | ICD-10-CM | POA: Diagnosis not present

## 2024-08-16 NOTE — Progress Notes (Signed)
 [facility] Counselor/Therapist Progress Note  Patient ID: Tracy Schneider, MRN: [id number],    Date: 08/16/2024  Time Spent: 43 minutes 803-846am  Treatment Type: Individual Therapy  Risk Assessment: Danger to Self:  No Self-injurious Behavior: No Danger to Others: No  Subjective: This session was held via video teletherapy. The patient consented to video teletherapy and was located in her home during this session. She is aware it is the responsibility of the patient to secure confidentiality on her end of the session. The provider was in a private home office for the duration of this session.    The patient arrived on time for the Caregility appointment.  Issues addressed: 1-she and spouse are planning a pregnancy -pt signed up for ST[employer] benefits 2-professional -pt's coworker is out for the 3rd extended leave in 5 years -pt's workload increases exponentially -pt had private meeting with boss about her limitations -supervisor suggested standard work protocols -clinic nurse is assigned to write the protocols -pt will then review the protocols -once completed will go to clinic supervisor for approval and implementation 3-sister Saddie is engaged -pt and her sister are doing better despite their previous disagreement related to finances -Saddie did not have money to pay for the family trip and the pt and her father subsidized 4-family trip -the past two years have been covered by her father/stepmother -pt's stepmother called to inform pt that she would need to pay her part -Benedict has already told the pt and her sister will have to pay their own way -pt admits that she and Kara talked about need to be upfront  Treatment Plan Problems: Anxiety, Childbearing/Rearing Decisions, Social Anxiety Symptoms: Hypervigilance (e.g., feeling constantly on edge, experiencing concentration difficulties, having trouble falling or staying asleep, exhibiting a general state of  irritability). Excessive and/or unrealistic worry that is difficult to control occurring more days than not for at least 6 months about a number of events or activities. Hypersensitivity to the criticism or disapproval of others. Reluctant involvement in social situations out of fear of saying or doing something foolish or of becoming emotional in front of others. Struggles with the decision whether and/or when to have (or forego) children based on issues such as infertility, career advancement, financial resources, personal values, and religious beliefs. Goals: Stabilize anxiety level while increasing ability to function on a daily basis. Interact socially without undue fear or anxiety. Develop the essential social skills that will enhance the quality of relationship life. Develop the ability to form relationships that will enhance recovery support system. Develop an effective decision-making approach to address whether and when to bear/raise children. Resolve conflict with partner regarding reproductive goals and the decision to have children. Objectives target date for all objectives is 03/11/2025: Maintain involvement in work, family, and social activities. Learn to accept limitations in life and commit to tolerating, rather than avoiding, unpleasant emotions while accomplishing meaningful goals. Learn and implement relapse prevention strategies for managing possible future anxiety symptoms. Identify and engage in pleasant activities on a daily basis. Learn and implement calming and coping strategies to manage anxiety symptoms during moments of social anxiety and lead to a more relaxed state in general. Verbalize and demonstrate an understanding and resolution of current interpersonal problems. Increase effective communication with partner regarding childbearing/rearing decisions. Identify at least two core themes related to conflict with partner regarding childbearing/rearing issue and  overtly communicate about them. Verbalize and reframe negative self-talk about childbearing decisions. Verbalize anxieties related to childbearing decisions. Describe the internal and  external pressures associated with childbearing decisions. Interventions: Teach and ask the client to practice relaxation and attentional focusing skills (e.g., staying focused externally and on behavioral goals, muscular relaxation, evenly paced diaphragmatic breathing, ride the wave of anxiety) for managing social anxiety symptoms and maintaining a more relaxed approach to life; review, reinforce successes; provide corrective feedback toward effective use. For interpersonal deficits, help the client develop new interpersonal skills and relationships. For interpersonal disputes, help the client explore the relationship, the nature of the dispute, whether it has reached an impasse, and available options to resolve it including learning and implementing conflict-resolution skills; if the relationship has reached an impasse, consider ways to change the impasse or to end the relationship. Engage the client in behavioral activation, increasing the client's contact with sources of reward, identifying processes that inhibit activation, and teaching skills to solve life problems (or assign Identify and Schedule Pleasant Activities in the Adult Psychotherapy Homework Planner by Jenniffer); use behavioral techniques such as instruction, rehearsal, role-playing, role reversal as needed to assist adoption into the client's daily life; reinforce success. Schedule periodic maintenance sessions to help the client maintain therapeutic gains. Support the client in following through with work, family, and social activities rather than escaping or avoiding them to focus on anxiety. Use techniques from Acceptance and Commitment Therapy to help client accept uncomfortable realities such as lack of complete control, imperfections, and  uncertainty and tolerate unpleasant emotions and thoughts in order to accomplish value-consistent goals. Reinforce the client's personal wants and needs with regard to childbearing. Explore the client's images of motherhood and the degree to which she believes she meets these ideals; discuss discrepancies between realistic and ideal images and resultant anxieties. Assist the client with exploring and challenging unrealistic perceptions and fears related to being the perfect mother. Assist the client in replacing negative/counterproductive coping strategies with positive/productive coping strategies; encourage those that lead to psychological and physical well-being (e.g., replace smoking with exercising). Explore with the client possible solutions to role conflict and overload (e.g., postpone having children; speak with boss/employer about flextime, time off, job sharing; hire a babysitter to assist with childcare so that she can complete school assignments).  Diagnosis:Generalized anxiety disorder  Plan:  -meet again on Friday, September 30, 2024 at 9am.

## 2024-08-19 DIAGNOSIS — M9906 Segmental and somatic dysfunction of lower extremity: Secondary | ICD-10-CM | POA: Diagnosis not present

## 2024-08-19 DIAGNOSIS — M7671 Peroneal tendinitis, right leg: Secondary | ICD-10-CM | POA: Diagnosis not present

## 2024-09-08 ENCOUNTER — Encounter: Payer: Self-pay | Admitting: Urgent Care

## 2024-09-08 DIAGNOSIS — M7521 Bicipital tendinitis, right shoulder: Secondary | ICD-10-CM

## 2024-09-08 DIAGNOSIS — G8929 Other chronic pain: Secondary | ICD-10-CM

## 2024-09-12 ENCOUNTER — Other Ambulatory Visit (HOSPITAL_COMMUNITY): Payer: Self-pay

## 2024-09-12 MED ORDER — ONDANSETRON 4 MG PO TBDP
4.0000 mg | ORAL_TABLET | Freq: Four times a day (QID) | ORAL | 2 refills | Status: AC | PRN
  Filled 2024-09-12: qty 15, 4d supply, fill #0

## 2024-09-30 ENCOUNTER — Encounter: Payer: Self-pay | Admitting: Professional

## 2024-09-30 ENCOUNTER — Ambulatory Visit: Admitting: Professional

## 2024-09-30 ENCOUNTER — Other Ambulatory Visit: Payer: Self-pay

## 2024-09-30 ENCOUNTER — Ambulatory Visit: Attending: Urgent Care | Admitting: Rehabilitative and Restorative Service Providers"

## 2024-09-30 ENCOUNTER — Encounter: Payer: Self-pay | Admitting: Rehabilitative and Restorative Service Providers"

## 2024-09-30 DIAGNOSIS — M6281 Muscle weakness (generalized): Secondary | ICD-10-CM | POA: Insufficient documentation

## 2024-09-30 DIAGNOSIS — M25511 Pain in right shoulder: Secondary | ICD-10-CM | POA: Insufficient documentation

## 2024-09-30 DIAGNOSIS — F411 Generalized anxiety disorder: Secondary | ICD-10-CM

## 2024-09-30 DIAGNOSIS — G8929 Other chronic pain: Secondary | ICD-10-CM | POA: Insufficient documentation

## 2024-09-30 DIAGNOSIS — M7521 Bicipital tendinitis, right shoulder: Secondary | ICD-10-CM | POA: Diagnosis not present

## 2024-09-30 NOTE — Progress Notes (Signed)
 "  [facility] Counselor/Therapist Progress Note  Patient ID: Tracy Schneider, MRN: [id number],    Date: 09/30/2024  Time Spent: 44 minutes 901-945am  Treatment Type: Individual Therapy  Risk Assessment: Danger to Self:  No Self-injurious Behavior: No Danger to Others: No  Subjective: This session was held via video teletherapy. The patient consented to video teletherapy and was located in her home during this session. She is aware it is the responsibility of the patient to secure confidentiality on her end of the session. The provider was in a private home office for the duration of this session.    The patient arrived on time for the Caregility appointment.  Issues addressed: 1-she and spouse are planning a pregnancy -pt was denied a second time and she plans to be 2-professional -pt's coworker will be back next week after an extended leave -pt shared she told the leader she was struggling with the RN on the team -leader was helping to try divert the attention from the pt -RN took off for a week and needed work was not done conservator, museum/gallery suggested pt speak directly with coworker and then production designer, theatre/television/film will address -pt met with supervisor who told her she must confront -suggested pt's supervisor believes that she can handle it -reminded her that if her boss steps in it removes her power 3-preparing for pregnancy -plans to try and get pregnant this summer  Treatment Plan Problems: Anxiety, Childbearing/Rearing Decisions, Social Anxiety Symptoms: Hypervigilance (e.g., feeling constantly on edge, experiencing concentration difficulties, having trouble falling or staying asleep, exhibiting a general state of irritability). Excessive and/or unrealistic worry that is difficult to control occurring more days than not for at least 6 months about a number of events or activities. Hypersensitivity to the criticism or disapproval of others. Reluctant involvement in social situations out  of fear of saying or doing something foolish or of becoming emotional in front of others. Struggles with the decision whether and/or when to have (or forego) children based on issues such as infertility, career advancement, financial resources, personal values, and religious beliefs. Goals: Stabilize anxiety level while increasing ability to function on a daily basis. Interact socially without undue fear or anxiety. Develop the essential social skills that will enhance the quality of relationship life. Develop the ability to form relationships that will enhance recovery support system. Develop an effective decision-making approach to address whether and when to bear/raise children. Resolve conflict with partner regarding reproductive goals and the decision to have children. Objectives target date for all objectives is 03/11/2025: Maintain involvement in work, family, and social activities. Learn to accept limitations in life and commit to tolerating, rather than avoiding, unpleasant emotions while accomplishing meaningful goals. Learn and implement relapse prevention strategies for managing possible future anxiety symptoms. Identify and engage in pleasant activities on a daily basis. Learn and implement calming and coping strategies to manage anxiety symptoms during moments of social anxiety and lead to a more relaxed state in general. Verbalize and demonstrate an understanding and resolution of current interpersonal problems. Increase effective communication with partner regarding childbearing/rearing decisions. Identify at least two core themes related to conflict with partner regarding childbearing/rearing issue and overtly communicate about them. Verbalize and reframe negative self-talk about childbearing decisions. Verbalize anxieties related to childbearing decisions. Describe the internal and external pressures associated with childbearing decisions. Interventions: Teach and ask the  client to practice relaxation and attentional focusing skills (e.g., staying focused externally and on behavioral goals, muscular relaxation, evenly paced diaphragmatic breathing, ride the  wave of anxiety) for managing social anxiety symptoms and maintaining a more relaxed approach to life; review, reinforce successes; provide corrective feedback toward effective use. For interpersonal deficits, help the client develop new interpersonal skills and relationships. For interpersonal disputes, help the client explore the relationship, the nature of the dispute, whether it has reached an impasse, and available options to resolve it including learning and implementing conflict-resolution skills; if the relationship has reached an impasse, consider ways to change the impasse or to end the relationship. Engage the client in behavioral activation, increasing the client's contact with sources of reward, identifying processes that inhibit activation, and teaching skills to solve life problems (or assign Identify and Schedule Pleasant Activities in the Adult Psychotherapy Homework Planner by Jenniffer); use behavioral techniques such as instruction, rehearsal, role-playing, role reversal as needed to assist adoption into the client's daily life; reinforce success. Schedule periodic maintenance sessions to help the client maintain therapeutic gains. Support the client in following through with work, family, and social activities rather than escaping or avoiding them to focus on anxiety. Use techniques from Acceptance and Commitment Therapy to help client accept uncomfortable realities such as lack of complete control, imperfections, and uncertainty and tolerate unpleasant emotions and thoughts in order to accomplish value-consistent goals. Reinforce the client's personal wants and needs with regard to childbearing. Explore the client's images of motherhood and the degree to which she believes she meets these ideals;  discuss discrepancies between realistic and ideal images and resultant anxieties. Assist the client with exploring and challenging unrealistic perceptions and fears related to being the perfect mother. Assist the client in replacing negative/counterproductive coping strategies with positive/productive coping strategies; encourage those that lead to psychological and physical well-being (e.g., replace smoking with exercising). Explore with the client possible solutions to role conflict and overload (e.g., postpone having children; speak with boss/employer about flextime, time off, job sharing; hire a babysitter to assist with childcare so that she can complete school assignments).  Diagnosis:Generalized anxiety disorder  Plan:  -pt plans to contact officials at the insurance level and through [facility] to complain -pt and spouse are leaving tomorrow for [place]. -meet again on Friday, February , 2026 at [facility]. "

## 2024-09-30 NOTE — Therapy (Signed)
 " OUTPATIENT PHYSICAL THERAPY SHOULDER EVALUATION   Patient Name: Tracy Schneider MRN: [id number] DOB:05/18/1991, 34 y.o., female Today's Date: 09/30/2024  END OF SESSION:  PT End of Session - 09/30/24 0905     Visit Number 1    Number of Visits 8    Date for Recertification  11/29/24    Authorization Type [employer]    PT Start Time [phone]    PT Stop Time 0840    PT Time Calculation (min) 37 min    Activity Tolerance Patient tolerated treatment well    Behavior During Therapy [facility] for tasks assessed/performed          Past Medical History:  Diagnosis Date   Anxiety    Hyperlipidemia    Past Surgical History:  Procedure Laterality Date   RHINOPLASTY  10/2005   septorhinoplasty   Patient Active Problem List   Diagnosis Date Noted   Hyperlipidemia 01/13/2023   Annual physical exam 12/20/2021   Chronic right hip pain 12/20/2021   Generalized anxiety disorder 09/13/2021   Skin lesion 09/04/2021   Chronic right shoulder pain 04/09/2021   Chronic right hamstring pain 04/09/2021    PCP: Lowella Benton CROME, PA  REFERRING PROVIDER: Lowella Benton CROME, PA  REFERRING DIAG:  M75.21 (ICD-10-CM) - Biceps tendonitis on right  M25.511,G89.29 (ICD-10-CM) - Chronic right shoulder pain   THERAPY DIAG:  Acute pain of right shoulder  Muscle weakness (generalized)  Rationale for Evaluation and Treatment: Rehabilitation  ONSET DATE: 09/11/2024  SUBJECTIVE:                                                                                                                                                                                      SUBJECTIVE STATEMENT: The patient reports h/o R biceps tear in 2022 (see MRI below) that she managed with rehab. She competes in ultimate frisbee and re-injured her R shoulder. She has tried home exercises for one month at home without resolution. She does not have pain at rest-- pain is provoked with R sidelying during sleep, reaching behind her,  and doing push ups. Hand dominance: Right  PERTINENT HISTORY: N/a  PAIN:  Are you having pain? Yes: NPRS scale: 0/10 at rest-- specific movements that elicit pain  Pain location: R shoulder Pain description: deep shoulder pain -- has never had biceps pain Aggravating factors: laying on the R shoulder, reaching posteriorly with horizontal abduction, and taking off clothing, sled push, push ups Relieving factors: no pain at rest  PRECAUTIONS: None  WEIGHT BEARING RESTRICTIONS: No  FALLS:  Has patient fallen in last 6 months? No  LIVING ENVIRONMENT: Lives with: lives with their spouse Lives  in: House/apartment  OCCUPATION: NP @ [employer]  PLOF: Independent  PATIENT GOALS:reduce pain, return to full exercise routine  OBJECTIVE:  Note: Objective measures were completed at Evaluation unless otherwise noted.  DIAGNOSTIC FINDINGS:  MRI 04/2021 MPRESSION: 1. Minimal tendinosis of the supraspinatus tendon. 2. Mild tendinosis of the intra-articular portion of the long head of the biceps tendon. Longitudinal split tear of the intra-articular portion and proximal extra-articular portion of the long head of the biceps tendon.  PATIENT SURVEYS:  UEFS 10%    SENSATION: WFL  POSTURE: WFLs  UPPER EXTREMITY ROM:  WFLs  UPPER EXTREMITY MMT: MMT Right eval Left eval  Shoulder flexion 5/5 with pain 5/5  Shoulder extension    Shoulder abduction 5/5 5/5  Shoulder adduction    Shoulder internal rotation 5/5 5/5  Shoulder external rotation 4+/5 with pain 5/5  (Blank rows = not tested)  SHOULDER SPECIAL TESTS: Biceps assessment: Yergason's test: negative and Speed's test: positive  Chest pec stretch is provoking for pain  PALPATION:  No tenderness to palpation                                                                                                                             [facility] Adult PT Treatment:                                                DATE:  09/30/24 Therapeutic Exercise: See HEP below Also trialed W exercise  prone-- too easy, Y exercise prone-- too challenging, and isotonic ER and IR--modified to reactive isometrics  PATIENT EDUCATION: Education details: HEP, plan of care Person educated: Patient Education method: Explanation, Demonstration, and Handouts Education comprehension: verbalized understanding, returned demonstration, and needs further education  HOME EXERCISE PROGRAM: Access Code: [email or web address] URL: [email or web address] Date: 09/30/2024 Prepared by: Tawni Ferrier  Exercises - Sidelying Shoulder ER with Towel and Dumbbell  - 1 x daily - 7 x weekly - 2 sets - 15 reps - Prone Shoulder Horizontal Abduction with Thumbs Up  - 1 x daily - 4 x weekly - 2 sets - 15 reps - Shoulder External Rotation Reactive Isometrics  - 2 x daily - 4 x weekly - 1 sets - 10 reps - Shoulder Internal Rotation Reactive Isometrics  - 2 x daily - 4 x weekly - 1 sets - 10 reps - Kneeling Plank with Scapular Protraction Retraction AROM  - 1 x daily - 4 x weekly - 2 sets - 10-15 reps  ASSESSMENT: CLINICAL IMPRESSION: Patient is a 34 y.o. female who was seen today for physical therapy evaluation and treatment for R shoulder pain. She presents today with pain provoked with resisted shoulder flexion, horizontal abduction AROM, pec stretch, resisted ER. She is active and PT and patient discussed activity modification and sleeping positions to reduce irritability. PT to address  deficits to return to pain free recreational and sports activities.   OBJECTIVE IMPAIRMENTS: decreased strength, impaired flexibility, and pain.   ACTIVITY LIMITATIONS: sleeping and reaching posteriorly  PARTICIPATION LIMITATIONS: community activity and recreational activities  PERSONAL FACTORS: n/a are also affecting patient's functional outcome.   REHAB POTENTIAL: Good  CLINICAL DECISION MAKING: Stable/uncomplicated  EVALUATION COMPLEXITY:  Low   GOALS: Goals reviewed with patient? Yes  SHORT TERM GOALS: Target date: 10/30/24  The patient will be indep with initial HEP Baseline: initiated at eval Goal status: INITIAL  2.  The patient will be able to sleep on the R side without R shoulder pain. Baseline:  unable Goal status: INITIAL  LONG TERM GOALS: Target date: 11/29/24  The patient will be indep with progression of HEP. Baseline:  initiated at eval Goal status: INITIAL  2.  The patient will improve UEFS to 0%  to demonstrate improved functional abilities. Baseline: see above Goal status: INITIAL  3.  The patient will demonstrate reaching posteriorly without pain. Baseline:  pain anterior/deep shoulder Goal status: INITIAL  4. The patient will return to full exercise routine without pain.  Baseline: unable  Goal status: INITIAL  PLAN:  PT FREQUENCY: 1x/week  PT DURATION: 4 weeks  PLANNED INTERVENTIONS: 02835- PT Re-evaluation, 97750- Physical Performance Testing, 97110-Therapeutic exercises, 97530- Therapeutic activity, [id number]- Neuromuscular re-education, 97535- Self Care, 02859- Manual therapy, [phone]- Ionotophoresis 4mg /ml Dexamethasone, 20560 (1-2 muscles), 20561 (3+ muscles)- Dry Needling, Patient/Family education, Taping, and Joint mobilization  PLAN FOR NEXT SESSION: update HEP, work on gentle stretching/opening anteriorly, serratus band with overhead reach (to add), progress strengthening.   Amyr Sluder, PT 09/30/2024, 10:55 AM  "

## 2024-10-07 ENCOUNTER — Ambulatory Visit: Admitting: Rehabilitative and Restorative Service Providers"

## 2024-10-14 ENCOUNTER — Ambulatory Visit: Attending: Urgent Care | Admitting: Rehabilitative and Restorative Service Providers"

## 2024-10-21 ENCOUNTER — Ambulatory Visit: Admitting: Rehabilitative and Restorative Service Providers"

## 2024-10-28 ENCOUNTER — Ambulatory Visit: Admitting: Professional

## 2024-11-25 ENCOUNTER — Ambulatory Visit: Admitting: Professional
